# Patient Record
Sex: Male | Born: 1960 | Race: Black or African American | Hispanic: No | Smoking: Never smoker
Health system: Southern US, Community
[De-identification: ages and names within clinical notes are randomized; demographics above are authoritative.]

## PROBLEM LIST (undated history)

## (undated) DIAGNOSIS — C61 Malignant neoplasm of prostate: Secondary | ICD-10-CM

## (undated) HISTORY — PX: ORCHIOPEXY: SHX479

## (undated) HISTORY — PX: PROSTATE SURGERY: SHX751

---

## 1998-09-07 ENCOUNTER — Encounter: Payer: Self-pay | Admitting: Orthopedic Surgery

## 1998-09-07 ENCOUNTER — Ambulatory Visit (HOSPITAL_COMMUNITY): Admission: RE | Admit: 1998-09-07 | Discharge: 1998-09-07 | Payer: Self-pay | Admitting: Orthopedic Surgery

## 1998-09-21 ENCOUNTER — Ambulatory Visit (HOSPITAL_COMMUNITY): Admission: RE | Admit: 1998-09-21 | Discharge: 1998-09-21 | Payer: Self-pay | Admitting: Orthopedic Surgery

## 1998-09-21 ENCOUNTER — Encounter: Payer: Self-pay | Admitting: Orthopedic Surgery

## 1998-10-06 ENCOUNTER — Ambulatory Visit (HOSPITAL_COMMUNITY): Admission: RE | Admit: 1998-10-06 | Discharge: 1998-10-06 | Payer: Self-pay | Admitting: Orthopedic Surgery

## 1998-10-06 ENCOUNTER — Encounter: Payer: Self-pay | Admitting: Orthopedic Surgery

## 1999-10-16 ENCOUNTER — Encounter: Admission: RE | Admit: 1999-10-16 | Discharge: 1999-10-16 | Payer: Self-pay | Admitting: Family Medicine

## 1999-10-16 ENCOUNTER — Encounter: Payer: Self-pay | Admitting: Family Medicine

## 1999-12-07 ENCOUNTER — Ambulatory Visit (HOSPITAL_COMMUNITY): Admission: RE | Admit: 1999-12-07 | Discharge: 1999-12-07 | Payer: Self-pay | Admitting: *Deleted

## 2000-08-21 ENCOUNTER — Other Ambulatory Visit: Admission: RE | Admit: 2000-08-21 | Discharge: 2000-08-21 | Payer: Self-pay | Admitting: Urology

## 2000-09-10 ENCOUNTER — Encounter: Payer: Self-pay | Admitting: Urology

## 2000-09-16 ENCOUNTER — Inpatient Hospital Stay (HOSPITAL_COMMUNITY): Admission: RE | Admit: 2000-09-16 | Discharge: 2000-09-18 | Payer: Self-pay | Admitting: Urology

## 2001-09-11 ENCOUNTER — Ambulatory Visit (HOSPITAL_COMMUNITY): Admission: RE | Admit: 2001-09-11 | Discharge: 2001-09-11 | Payer: Self-pay | Admitting: *Deleted

## 2004-04-24 ENCOUNTER — Encounter: Admission: RE | Admit: 2004-04-24 | Discharge: 2004-04-24 | Payer: Self-pay | Admitting: Orthopedic Surgery

## 2004-05-11 ENCOUNTER — Encounter: Admission: RE | Admit: 2004-05-11 | Discharge: 2004-05-11 | Payer: Self-pay | Admitting: Orthopedic Surgery

## 2005-09-13 ENCOUNTER — Encounter: Admission: RE | Admit: 2005-09-13 | Discharge: 2005-09-13 | Payer: Self-pay | Admitting: Surgery

## 2005-09-16 ENCOUNTER — Ambulatory Visit (HOSPITAL_BASED_OUTPATIENT_CLINIC_OR_DEPARTMENT_OTHER): Admission: RE | Admit: 2005-09-16 | Discharge: 2005-09-16 | Payer: Self-pay | Admitting: Surgery

## 2013-01-31 ENCOUNTER — Emergency Department (HOSPITAL_COMMUNITY): Payer: BC Managed Care – PPO

## 2013-01-31 ENCOUNTER — Observation Stay (HOSPITAL_COMMUNITY)
Admission: EM | Admit: 2013-01-31 | Discharge: 2013-02-01 | Disposition: A | Discharge status: Home / Self Care | Payer: BC Managed Care – PPO | Attending: Internal Medicine | Admitting: Internal Medicine

## 2013-01-31 ENCOUNTER — Observation Stay (HOSPITAL_COMMUNITY): Payer: BC Managed Care – PPO

## 2013-01-31 ENCOUNTER — Encounter (HOSPITAL_COMMUNITY): Payer: Self-pay | Admitting: Emergency Medicine

## 2013-01-31 DIAGNOSIS — I369 Nonrheumatic tricuspid valve disorder, unspecified: Secondary | ICD-10-CM

## 2013-01-31 DIAGNOSIS — R569 Unspecified convulsions: Secondary | ICD-10-CM

## 2013-01-31 DIAGNOSIS — Y92410 Unspecified street and highway as the place of occurrence of the external cause: Secondary | ICD-10-CM | POA: Insufficient documentation

## 2013-01-31 DIAGNOSIS — Y9389 Activity, other specified: Secondary | ICD-10-CM | POA: Insufficient documentation

## 2013-01-31 DIAGNOSIS — E871 Hypo-osmolality and hyponatremia: Secondary | ICD-10-CM | POA: Diagnosis present

## 2013-01-31 DIAGNOSIS — R55 Syncope and collapse: Secondary | ICD-10-CM

## 2013-01-31 DIAGNOSIS — Z8546 Personal history of malignant neoplasm of prostate: Secondary | ICD-10-CM | POA: Insufficient documentation

## 2013-01-31 DIAGNOSIS — S01502A Unspecified open wound of oral cavity, initial encounter: Secondary | ICD-10-CM | POA: Insufficient documentation

## 2013-01-31 DIAGNOSIS — R030 Elevated blood-pressure reading, without diagnosis of hypertension: Secondary | ICD-10-CM

## 2013-01-31 DIAGNOSIS — Z79899 Other long term (current) drug therapy: Secondary | ICD-10-CM | POA: Insufficient documentation

## 2013-01-31 DIAGNOSIS — Z7982 Long term (current) use of aspirin: Secondary | ICD-10-CM | POA: Insufficient documentation

## 2013-01-31 HISTORY — DX: Malignant neoplasm of prostate: C61

## 2013-01-31 LAB — URINALYSIS, ROUTINE W REFLEX MICROSCOPIC
Bilirubin Urine: NEGATIVE
Glucose, UA: NEGATIVE mg/dL
Ketones, ur: NEGATIVE mg/dL
Leukocytes, UA: NEGATIVE
Nitrite: NEGATIVE
Protein, ur: NEGATIVE mg/dL
Specific Gravity, Urine: 1.003 — ABNORMAL LOW (ref 1.005–1.030)
Urobilinogen, UA: 0.2 mg/dL (ref 0.0–1.0)
pH: 6.5 (ref 5.0–8.0)

## 2013-01-31 LAB — RAPID URINE DRUG SCREEN, HOSP PERFORMED
Amphetamines: NOT DETECTED
Barbiturates: NOT DETECTED
Benzodiazepines: NOT DETECTED
Cocaine: NOT DETECTED
Opiates: NOT DETECTED
Tetrahydrocannabinol: NOT DETECTED

## 2013-01-31 LAB — COMPREHENSIVE METABOLIC PANEL
ALT: 21 U/L (ref 0–53)
AST: 42 U/L — ABNORMAL HIGH (ref 0–37)
Albumin: 4.3 g/dL (ref 3.5–5.2)
Alkaline Phosphatase: 59 U/L (ref 39–117)
BUN: 17 mg/dL (ref 6–23)
CO2: 25 mEq/L (ref 19–32)
Calcium: 9 mg/dL (ref 8.4–10.5)
Chloride: 86 mEq/L — ABNORMAL LOW (ref 96–112)
Creatinine, Ser: 0.94 mg/dL (ref 0.50–1.35)
GFR calc Af Amer: >90 mL/min (ref >90)
GFR calc non Af Amer: >90 mL/min (ref >90)
Glucose, Bld: 86 mg/dL (ref 70–99)
Potassium: 3.7 mEq/L (ref 3.5–5.1)
Sodium: 125 mEq/L — ABNORMAL LOW (ref 135–145)
Total Bilirubin: 0.8 mg/dL (ref 0.3–1.2)
Total Protein: 7.4 g/dL (ref 6.0–8.3)

## 2013-01-31 LAB — CBC
HCT: 41.8 % (ref 39.0–52.0)
Hemoglobin: 15 g/dL (ref 13.0–17.0)
MCH: 31.3 pg (ref 26.0–34.0)
MCHC: 35.9 g/dL (ref 30.0–36.0)
MCV: 87.1 fL (ref 78.0–100.0)
Platelets: 234 10*3/uL (ref 150–400)
RBC: 4.8 MIL/uL (ref 4.22–5.81)
RDW: 11.6 % (ref 11.5–15.5)
WBC: 18.6 10*3/uL — ABNORMAL HIGH (ref 4.0–10.5)

## 2013-01-31 LAB — POCT I-STAT TROPONIN I: Troponin i, poc: 0 ng/mL (ref 0.00–0.08)

## 2013-01-31 LAB — ETHANOL: Alcohol, Ethyl (B): <11 mg/dL (ref 0–11)

## 2013-01-31 LAB — POCT I-STAT, CHEM 8
BUN: 17 mg/dL (ref 6–23)
Calcium, Ion: 1.14 mmol/L (ref 1.12–1.23)
Chloride: 90 mEq/L — ABNORMAL LOW (ref 96–112)
Creatinine, Ser: 1.2 mg/dL (ref 0.50–1.35)
Glucose, Bld: 99 mg/dL (ref 70–99)
HCT: 47 % (ref 39.0–52.0)
Hemoglobin: 16 g/dL (ref 13.0–17.0)
Potassium: 3.8 mEq/L (ref 3.5–5.1)
Sodium: 126 mEq/L — ABNORMAL LOW (ref 135–145)
TCO2: 24 mmol/L (ref 0–100)

## 2013-01-31 LAB — TROPONIN I
Troponin I: <0.3 ng/mL (ref <0.30)
Troponin I: <0.3 ng/mL (ref <0.30)

## 2013-01-31 LAB — URINE MICROSCOPIC-ADD ON

## 2013-01-31 LAB — OSMOLALITY, URINE: Osmolality, Ur: 77 mOsm/kg — ABNORMAL LOW (ref 390–1090)

## 2013-01-31 LAB — MAGNESIUM: Magnesium: 2.2 mg/dL (ref 1.5–2.5)

## 2013-01-31 MED ORDER — ENOXAPARIN SODIUM 40 MG/0.4ML ~~LOC~~ SOLN
40.0000 mg | SUBCUTANEOUS | Status: DC
  Administered 2013-01-31: 40 mg via SUBCUTANEOUS
  Filled 2013-01-31 (×2): qty 0.4

## 2013-01-31 MED ORDER — ENOXAPARIN SODIUM 40 MG/0.4ML ~~LOC~~ SOLN
40.0000 mg | SUBCUTANEOUS | Status: DC
  Filled 2013-01-31: qty 0.4

## 2013-01-31 MED ORDER — ACETAMINOPHEN 325 MG PO TABS
650.0000 mg | ORAL_TABLET | Freq: Four times a day (QID) | ORAL | Status: DC | PRN
  Administered 2013-01-31: 650 mg via ORAL
  Filled 2013-01-31: qty 2

## 2013-01-31 MED ORDER — SODIUM CHLORIDE 0.9 % IV SOLN
INTRAVENOUS | Status: DC
  Administered 2013-01-31 (×2): via INTRAVENOUS

## 2013-01-31 MED ORDER — LABETALOL HCL 5 MG/ML IV SOLN
10.0000 mg | Freq: Once | INTRAVENOUS | Status: AC
  Administered 2013-01-31: 10 mg via INTRAVENOUS
  Filled 2013-01-31: qty 4

## 2013-01-31 MED ORDER — VITAMIN B-1 100 MG PO TABS
100.0000 mg | ORAL_TABLET | Freq: Every day | ORAL | Status: DC
  Administered 2013-02-01: 100 mg via ORAL
  Filled 2013-01-31 (×2): qty 1

## 2013-01-31 MED ORDER — ONDANSETRON HCL 4 MG PO TABS: 4.0000 mg | ORAL_TABLET | Freq: Four times a day (QID) | ORAL | Status: DC | PRN

## 2013-01-31 MED ORDER — SODIUM CHLORIDE 0.9 % IJ SOLN: 3.0000 mL | Freq: Two times a day (BID) | INTRAMUSCULAR | Status: DC

## 2013-01-31 MED ORDER — ONDANSETRON HCL 4 MG/2ML IJ SOLN: 4.0000 mg | Freq: Four times a day (QID) | INTRAMUSCULAR | Status: DC | PRN

## 2013-01-31 MED ORDER — ASPIRIN EC 81 MG PO TBEC
81.0000 mg | DELAYED_RELEASE_TABLET | Freq: Every day | ORAL | Status: DC
  Administered 2013-02-01: 81 mg via ORAL
  Filled 2013-01-31 (×2): qty 1

## 2013-01-31 MED ORDER — HYDRALAZINE HCL 20 MG/ML IJ SOLN: 5.0000 mg | Freq: Four times a day (QID) | INTRAMUSCULAR | Status: DC | PRN

## 2013-01-31 MED ORDER — ACETAMINOPHEN 650 MG RE SUPP: 650.0000 mg | Freq: Four times a day (QID) | RECTAL | Status: DC | PRN

## 2013-01-31 MED ORDER — FLUTICASONE PROPIONATE 50 MCG/ACT NA SUSP
1.0000 | Freq: Every day | NASAL | Status: DC
  Administered 2013-01-31 – 2013-02-01 (×2): 1 via NASAL
  Filled 2013-01-31: qty 16

## 2013-01-31 NOTE — ED Notes (Signed)
Pt remains in MRI, will transport upstairs to ready bed following MRI.

## 2013-01-31 NOTE — ED Notes (Signed)
Attempted to give EKG to Attending Physician. Dr. Radford Pax did not accept it at this time.
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
## 2013-01-31 NOTE — Progress Notes (Signed)
VASCULAR LAB PRELIMINARY  PRELIMINARY  PRELIMINARY  PRELIMINARY  Carotid Dopplers completed.    Preliminary report:  There is 1-39% ICA stenosis.  Minimal plaque noted.  Mild intimal wall thickening CCA.  Bilateral vertebral artery flow is antegrade.  Justin Macias, RVT 01/31/2013, 2:33 PM
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
## 2013-01-31 NOTE — Progress Notes (Signed)
  Echocardiogram 2D Echocardiogram has been performed.  Georgian Co 01/31/2013, 5:29 PM

## 2013-02-01 ENCOUNTER — Observation Stay (HOSPITAL_COMMUNITY): Payer: BC Managed Care – PPO

## 2013-02-01 LAB — COMPREHENSIVE METABOLIC PANEL
ALT: 23 U/L (ref 0–53)
AST: 66 U/L — ABNORMAL HIGH (ref 0–37)
Albumin: 3.5 g/dL (ref 3.5–5.2)
Alkaline Phosphatase: 49 U/L (ref 39–117)
BUN: 16 mg/dL (ref 6–23)
CO2: 28 mEq/L (ref 19–32)
Calcium: 8.6 mg/dL (ref 8.4–10.5)
Chloride: 102 mEq/L (ref 96–112)
Creatinine, Ser: 1.16 mg/dL (ref 0.50–1.35)
GFR calc Af Amer: 81 mL/min — ABNORMAL LOW (ref >90)
GFR calc non Af Amer: 70 mL/min — ABNORMAL LOW (ref >90)
Glucose, Bld: 96 mg/dL (ref 70–99)
Potassium: 4 mEq/L (ref 3.5–5.1)
Sodium: 138 mEq/L (ref 135–145)
Total Bilirubin: 1 mg/dL (ref 0.3–1.2)
Total Protein: 6.6 g/dL (ref 6.0–8.3)

## 2013-02-01 LAB — CBC
HCT: 40.6 % (ref 39.0–52.0)
Hemoglobin: 14.3 g/dL (ref 13.0–17.0)
MCH: 31.7 pg (ref 26.0–34.0)
MCHC: 35.2 g/dL (ref 30.0–36.0)
MCV: 90 fL (ref 78.0–100.0)
Platelets: 218 10*3/uL (ref 150–400)
RBC: 4.51 MIL/uL (ref 4.22–5.81)
RDW: 12.1 % (ref 11.5–15.5)
WBC: 13.8 10*3/uL — ABNORMAL HIGH (ref 4.0–10.5)

## 2013-02-01 MED ORDER — THIAMINE HCL 100 MG PO TABS: 100.0000 mg | ORAL_TABLET | Freq: Every day | ORAL | Status: DC

## 2013-02-01 MED ORDER — AMLODIPINE BESYLATE 2.5 MG PO TABS
2.5000 mg | ORAL_TABLET | Freq: Every day | ORAL | Status: DC
  Filled 2013-02-01: qty 1

## 2013-02-01 MED ORDER — AMLODIPINE BESYLATE 5 MG PO TABS: 5.0000 mg | ORAL_TABLET | Freq: Every day | ORAL | Status: DC

## 2013-02-01 MED ORDER — FLUTICASONE PROPIONATE 50 MCG/ACT NA SUSP: 1.0000 | Freq: Every day | NASAL | Status: DC

## 2013-02-01 MED ORDER — AMLODIPINE BESYLATE 2.5 MG PO TABS: 2.5000 mg | ORAL_TABLET | Freq: Every day | ORAL | Status: DC

## 2013-02-01 NOTE — Procedures (Signed)
ELECTROENCEPHALOGRAM REPORT   Patient: Justin Macias       Room #: 5A21 EEG No. ID: [id number] Age: 52 y.o.        Sex: male Referring Physician: Rai Report Date:  02/01/2013        Interpreting Physician: Thana Farr D  History: Justin Macias is an 52 y.o. male presenting after an amnestic episode  Medications:  I have reviewed the patient's current medications. Scheduled: . amLODipine  2.5 mg Oral Daily  . aspirin EC  81 mg Oral Daily  . enoxaparin (LOVENOX) injection  40 mg Subcutaneous Q24H  . fluticasone  1 spray Each Nare Daily  . sodium chloride  3 mL Intravenous Q12H  . thiamine  100 mg Oral Daily    Conditions of Recording:  This is a 16 channel EEG carried out with the patient in the awake state.  Description:  The waking background activity consists of a low voltage, symmetrical, fairly well organized, 12 Hz alpha activity, seen from the parieto-occipital and posterior temporal regions.  Low voltage fast activity, poorly organized, is seen anteriorly and is at times superimposed on more posterior regions.  A mixture of theta and alpha rhythms are seen from the central and temporal regions. The patient drowses with slowing to irregular, low voltage theta and beta activity.   The patient does not drowse or sleep. Hyperventilation and intermittent photic stimulation were performed but failed to elicit any abnormalities.  IMPRESSION: This is a normal awake electroencephalogram.  No epileptiform activity is noted.    Comment:  An EEG with the patient sleep deprived to elicit drowse and light sleep may be desirable to further elicit a possible seizure disorder.     Thana Farr, MD [facility] [phone] 02/01/2013, 2:51 PM
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
## 2013-02-01 NOTE — Progress Notes (Signed)
EEG Completed; Results Pending  

## 2013-02-01 NOTE — Progress Notes (Signed)
Pt provided with dc instructions and education. Pt verbalized udnerstanding. No questions at this time. IV removed with tip intact. Heart monitor cleaned and returned to front. Levonne Spiller, RN
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
## 2013-02-15 ENCOUNTER — Ambulatory Visit (INDEPENDENT_AMBULATORY_CARE_PROVIDER_SITE_OTHER): Payer: BC Managed Care – PPO | Admitting: Neurology

## 2013-02-15 ENCOUNTER — Encounter: Payer: Self-pay | Admitting: Neurology

## 2013-02-15 VITALS — BP 130/80 | HR 66 | Temp 98.1°F | Ht 67.0 in | Wt 148.0 lb

## 2013-02-15 DIAGNOSIS — R569 Unspecified convulsions: Secondary | ICD-10-CM

## 2013-02-15 NOTE — Patient Instructions (Signed)
The seizure could have been caused by the low sodium.  However, it is more typically seen with levels less than 120 (your level was 125).  I would restrict you from driving at this time and then follow up in one month.  We will recheck a sodium level.  If it is normal, we may reinstate driving privileges.  Since this is a one time seizure, I will not start seizure medication anyway.
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
## 2013-03-17 ENCOUNTER — Ambulatory Visit (INDEPENDENT_AMBULATORY_CARE_PROVIDER_SITE_OTHER): Payer: BC Managed Care – PPO | Admitting: Neurology

## 2013-03-17 ENCOUNTER — Encounter: Payer: Self-pay | Admitting: Neurology

## 2013-03-17 VITALS — BP 142/82 | HR 60 | Temp 97.9°F | Ht 67.0 in | Wt 147.0 lb

## 2013-03-17 DIAGNOSIS — R569 Unspecified convulsions: Secondary | ICD-10-CM

## 2013-03-17 LAB — BASIC METABOLIC PANEL
BUN: 16 mg/dL (ref 6–23)
CO2: 29 mEq/L (ref 19–32)
Calcium: 9.2 mg/dL (ref 8.4–10.5)
Chloride: 102 mEq/L (ref 96–112)
Creatinine, Ser: 1.1 mg/dL (ref 0.4–1.5)
GFR: 90.66 mL/min (ref >60.00)
Glucose, Bld: 91 mg/dL (ref 70–99)
Potassium: 4.6 mEq/L (ref 3.5–5.1)
Sodium: 139 mEq/L (ref 135–145)

## 2013-03-17 NOTE — Patient Instructions (Signed)
1.  We will check another sodium level today.  If it is okay then you may resume driving. 2.  I would like you to follow up in 3 months to make sure everything is okay.

## 2013-03-17 NOTE — Addendum Note (Signed)
Addended by: Benay Spice on: 03/17/2013 09:52 AM   Modules accepted: Orders
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
## 2013-03-17 NOTE — Addendum Note (Signed)
Addended by: Benay Spice on: 03/17/2013 10:04 AM   Modules accepted: Orders

## 2013-03-18 ENCOUNTER — Telehealth: Payer: Self-pay | Admitting: Neurology

## 2013-03-18 NOTE — Telephone Encounter (Signed)
Left the patient a message on his home answering machine that his lab work came back normal and that he could start driving again as per Dr. Moises Blood instructions.

## 2013-06-16 ENCOUNTER — Ambulatory Visit: Payer: BC Managed Care – PPO | Admitting: Neurology

## 2017-07-28 ENCOUNTER — Encounter: Payer: Self-pay | Admitting: Radiation Oncology

## 2017-08-21 ENCOUNTER — Encounter: Payer: Self-pay | Admitting: Radiation Oncology

## 2017-08-21 NOTE — Progress Notes (Signed)
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 Retropubic prostatectomy done 09/22/2000. Unfortunately, patient had focal capsular extension/margin involvement.  If Prostate Cancer, Gleason Score is (3 + 4) and PSA is (4.5) pretreatment.   07/21/2017 PSA  0.045 07/2016  PSA  0.033 07/2015  PSA  0.03 06/2014  PSA  0.03 06/2013  PSA  0.02    Past/Anticipated interventions by urology, if any: prostatectomy, routine PSA check, referral to radiation oncology for consideration of salvage XRT in light of rising PSA  Past/Anticipated interventions by medical oncology, if any: no  Weight changes, if any: no  Bowel/Bladder complaints, if any: Denies urinary incontinence. Denies ED. Reports rare urinary leakage only when he is very tired. Denies dysuria or hematuria. IPSS 0.   Nausea/Vomiting, if any: no  Pain issues, if any:  no  SAFETY ISSUES:  Prior radiation? no  Pacemaker/ICD? no  Possible current pregnancy? no  Is the patient on methotrexate? no  Current Complaints / other details:  57 year old male. Divorced. AX: Sulfa Brother with hx of prostate ca. Another brother and mother with hx of colon ca. Maternal uncle with prostate ca. Works for [employer]. Has one daughter. Resides in [place]. One of 11 children. All are alive but one that died in a motorcycle accident at 52.

## 2017-08-25 ENCOUNTER — Encounter: Payer: Self-pay | Admitting: Medical Oncology

## 2017-08-25 ENCOUNTER — Encounter: Payer: Self-pay | Admitting: Radiation Oncology

## 2017-08-25 ENCOUNTER — Other Ambulatory Visit: Payer: Self-pay | Admitting: Radiation Oncology

## 2017-08-25 ENCOUNTER — Ambulatory Visit
Admission: RE | Admit: 2017-08-25 | Discharge: 2017-08-25 | Disposition: A | Discharge status: Home / Self Care | Payer: BLUE CROSS/BLUE SHIELD | Source: Ambulatory Visit | Attending: Radiation Oncology | Admitting: Radiation Oncology

## 2017-08-25 ENCOUNTER — Other Ambulatory Visit: Payer: Self-pay

## 2017-08-25 VITALS — BP 162/101 | HR 70 | Temp 98.0°F | Resp 16 | Ht 68.0 in | Wt 152.8 lb

## 2017-08-25 DIAGNOSIS — C61 Malignant neoplasm of prostate: Secondary | ICD-10-CM | POA: Diagnosis not present

## 2017-08-25 DIAGNOSIS — Z79899 Other long term (current) drug therapy: Secondary | ICD-10-CM | POA: Diagnosis not present

## 2017-08-25 DIAGNOSIS — Z7982 Long term (current) use of aspirin: Secondary | ICD-10-CM | POA: Insufficient documentation

## 2017-08-25 DIAGNOSIS — Z809 Family history of malignant neoplasm, unspecified: Secondary | ICD-10-CM | POA: Diagnosis not present

## 2017-08-25 DIAGNOSIS — Z800 Family history of malignant neoplasm of digestive organs: Secondary | ICD-10-CM | POA: Diagnosis not present

## 2017-08-25 NOTE — Progress Notes (Signed)
Introduced myself to Mr. Justin Macias as the prostate nurse navigator and my role. He was diagnosed with prostate cancer in 2002 and under went prostatectomy. He PSA has slowly been rising. He is here today to discuss salvage radiation. He has a brother with history of prostate cancer and family history of colon cancer. He has never had genetic studies but might be interested since he has a daughter. I gave him my business card and will continue to follow.

## 2017-08-25 NOTE — Progress Notes (Signed)
See progress note under physician encounter. 

## 2017-08-25 NOTE — Progress Notes (Signed)
Radiation Oncology         [phone] ________________________________  Initial Outpatient Consultation  Name: Justin Macias MRN: [id number]  Date: 08/25/2017  DOB: 10-Dec-1960  CC:Justin Jordan, MD  Franchot Gallo, MD   REFERRING PHYSICIAN: Franchot Gallo, MD  DIAGNOSIS: 57 y.o. 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 Malignant neoplasm of prostate ([place]) C61   2. Family history of cancer Z80.9     HISTORY OF PRESENT ILLNESS: Justin Macias is a 57 y.o. male with a diagnosis of prostate cancer in 2002. He underwent radical prostatectomy on 09/22/00 for Geason 3+4 cancer. Final pathology revealed a pT3a, N0 prostate cancer with Gleason score of 3+4. He had focal extracapsular extension and positive margins on the left. He remained under follow-up with Dr. Diona Fanti with undetected PSA until 2015. The patient's PSA was noted to slightly elevate over the past several years, as noted:  07/21/17 0.045 07/2016  0.033 07/2015  0.03 06/2014  0.03 06/2013  0.02  The patient presented to Dr. Diona Fanti on 07/28/17. Per his note, he discussed the unusual risk of biochemical recurrence over 15 years out from his curative procedure. However, given the patient's rising PSA. Dr. Diona Fanti referred the patient to radiation oncology for consideration of salvage radiotherapy.    PREVIOUS RADIATION THERAPY: No  PAST MEDICAL HISTORY:  Past Medical History:  Diagnosis Date  . Prostate cancer ([place])       PAST SURGICAL HISTORY: Past Surgical History:  Procedure Laterality Date  . ORCHIOPEXY    . PROSTATE SURGERY      FAMILY HISTORY:  Family History  Problem Relation Age of Onset  . Heart attack Father   . Prostate cancer Brother   . Colon cancer Mother   . Prostate cancer Maternal Uncle   . Colon cancer Brother     SOCIAL HISTORY:  Social  History   Socioeconomic History  . Marital status: Divorced    Spouse name: Not on file  . Number of children: 1  . Years of education: Not on file  . Highest education level: Not on file  Occupational History    Employer: [employer]  . Financial resource strain: Not on file  . Food insecurity:    Worry: Not on file    Inability: Not on file  . Transportation needs:    Medical: Not on file    Non-medical: Not on file  Tobacco Use  . Smoking status: Never Smoker  . Smokeless tobacco: Never Used  Substance and Sexual Activity  . Alcohol use: Yes    Comment: occasional  . Drug use: No  . Sexual activity: Not Currently  Lifestyle  . Physical activity:    Days per week: Not on file    Minutes per session: Not on file  . Stress: Not on file  Relationships  . Social connections:    Talks on phone: Not on file    Gets together: Not on file    Attends religious service: Not on file    Active member of club or organization: Not on file    Attends meetings of clubs or organizations: Not on file    Relationship status: Not on file  . Intimate partner violence:    Fear of current or ex partner: Not on file    Emotionally abused: Not on  file    Physically abused: Not on file    Forced sexual activity: Not on file  Other Topics Concern  . Not on file  Social History Narrative  . Not on file  The patient is divorced and lives in [place]. He works for [employer].  ALLERGIES: Sulfa antibiotics  MEDICATIONS:  Current Outpatient Medications  Medication Sig Dispense Refill  . amLODipine (NORVASC) 2.5 MG tablet Take 1 tablet (2.5 mg total) by mouth daily. 30 tablet 3  . aspirin EC 81 MG tablet Take 81 mg by mouth daily.    Marland Kitchen GARLIC PO Take 1 capsule by mouth daily.    . Red Yeast Rice Extract (RED YEAST RICE PO) Take 1 capsule by mouth daily.     No current facility-administered medications for this encounter.     REVIEW OF SYSTEMS:  On review of systems, the patient  reports that he is doing well overall. He denies any chest pain, shortness of breath, cough, fevers, chills, night sweats, unintended weight changes. He denies any bowel disturbances, and denies abdominal pain, nausea or vomiting. He denies any new musculoskeletal or joint aches or pains. His IPSS was 0, indicating no urinary symptoms. He is able to complete sexual activity with all  attempts. A complete review of systems is obtained and is otherwise negative.    PHYSICAL EXAM:  Wt Readings from Last 3 Encounters:  08/25/17 152 lb 12.8 oz (69.3 kg)  03/17/13 147 lb (66.7 kg)  02/15/13 148 lb (67.1 kg)   Temp Readings from Last 3 Encounters:  08/25/17 98 F (36.7 C) (Oral)  03/17/13 97.9 F (36.6 C)  02/15/13 98.1 F (36.7 C)   BP Readings from Last 3 Encounters:  08/25/17 (!) 162/101  03/17/13 (!) 142/82  02/15/13 130/80   Pulse Readings from Last 3 Encounters:  08/25/17 70  03/17/13 60  02/15/13 66   Pain Assessment Pain Score: 0-No pain/10  In general this is a well appearing African American male in no acute distress. He is alert and oriented x4 and appropriate throughout the examination. HEENT reveals that the patient is normocephalic, atraumatic. EOMs are intact. Cardiopulmonary assessment is negative for acute distress and he exhibits normal effort.    KPS = 100  100 - Normal; no complaints; no evidence of disease. 90   - Able to carry on normal activity; minor signs or symptoms of disease. 80   - Normal activity with effort; some signs or symptoms of disease. 46   - Cares for self; unable to carry on normal activity or to do active work. 60   - Requires occasional assistance, but is able to care for most of his personal needs. 50   - Requires considerable assistance and frequent medical care. 7   - Disabled; requires special care and assistance. 77   - Severely disabled; hospital admission is indicated although death not imminent. 63   - Very sick; hospital  admission necessary; active supportive treatment necessary. 10   - Moribund; fatal processes progressing rapidly. 0     - Dead  Karnofsky DA, Abelmann [place], Craver LS and Burchenal [facility] [phone]) The use of the nitrogen mustards in the palliative treatment of carcinoma: with particular reference to bronchogenic carcinoma Cancer 1 634-56  LABORATORY DATA:  Lab Results  Component Value Date   WBC 13.8 (H) 02/01/2013   HGB 14.3 02/01/2013   HCT 40.6 02/01/2013   MCV 90.0 02/01/2013   PLT 218 02/01/2013   Lab Results  Component  Value Date   NA 139 03/17/2013   K 4.6 03/17/2013   CL 102 03/17/2013   CO2 29 03/17/2013   Lab Results  Component Value Date   ALT 23 02/01/2013   AST 66 (H) 02/01/2013   ALKPHOS 49 02/01/2013   BILITOT 1.0 02/01/2013     RADIOGRAPHY: No results found.    IMPRESSION/PLAN: 1. 57 y.o. 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 The patient is counseled on the findings and role of radiotherapy to the prostatic fossa over 7 1/2 weeks. We discussed the risks, benefits, short, and long term effects of radiotherapy, and the patient is interested in proceeding. We will have simulation reach out to him to coordinate this and move forward as outlined. 2. Possible genetic predisposition to malignancy. The patient is a candidate for genetic testing given his personal and family history. He was offered referral and is interested in meeting.  In a visit lasting 45 minutes, greater than 50% of the time was spent face to face discussing his case, and coordinating the patient's care.     Carola Rhine, PAC    Tyler Pita, MD  [facility] Direct Dial: [phone]  Fax: [phone] [email or web address]  Skype  LinkedIn    Page Me       This document serves as a record of services personally performed by Tyler Pita, MD and Shona Simpson, PA-C. It was created on their behalf by Bethann Humble, a trained medical scribe. The creation of this record is based on the scribe's personal observations and the provider's statements to them. This document has been checked and approved by the attending provider.

## 2017-09-08 ENCOUNTER — Ambulatory Visit
Admission: RE | Admit: 2017-09-08 | Discharge: 2017-09-08 | Disposition: A | Discharge status: Home / Self Care | Payer: BLUE CROSS/BLUE SHIELD | Source: Ambulatory Visit | Attending: Radiation Oncology | Admitting: Radiation Oncology

## 2017-09-08 ENCOUNTER — Encounter: Payer: Self-pay | Admitting: Medical Oncology

## 2017-09-08 DIAGNOSIS — C61 Malignant neoplasm of prostate: Secondary | ICD-10-CM | POA: Diagnosis not present

## 2017-09-08 DIAGNOSIS — Z510 Encounter for antineoplastic radiation therapy: Secondary | ICD-10-CM | POA: Insufficient documentation

## 2017-09-08 NOTE — Progress Notes (Signed)
  Radiation Oncology         [phone] ________________________________  Name: Justin Macias MRN: [id number]  Date: 09/08/2017  DOB: 1960/06/09  SIMULATION AND TREATMENT PLANNING NOTE    ICD-10-CM   1. Malignant neoplasm of prostate ([place]) C61     DIAGNOSIS: 57 y.o. 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  Identity was confirmed.  All relevant records and images related to the planned course of therapy were reviewed.  The patient freely provided informed written consent to proceed with treatment after reviewing the details related to the planned course of therapy. The consent form was witnessed and verified by the simulation staff.  Then, the patient was set-up in a stable reproducible supine position for radiation therapy.  A vacuum lock pillow device was custom fabricated to position his legs in a reproducible immobilized position.  Then, I performed a urethrogram under sterile conditions to identify the prostatic bed.  CT images were obtained.  Surface markings were placed.  The CT images were loaded into the planning software.  Then the prostate bed target, pelvic lymph node target and avoidance structures including the rectum, bladder, bowel and hips were contoured.  Treatment planning then occurred.  The radiation prescription was entered and confirmed.  A total of one complex treatment devices were fabricated. I have requested : Intensity Modulated Radiotherapy (IMRT) is medically necessary for this case for the following reason:  Rectal sparing.Marland Kitchen  PLAN:  The patient will receive 45 Gy in 25 fractions of 1.8 Gy, followed by a boost to the prostate bed to a total dose of 68.4 Gy with 13 additional fractions of 1.8 Gy.   ________________________________  Sheral Apley Tammi Klippel, M.D.

## 2017-09-15 DIAGNOSIS — Z510 Encounter for antineoplastic radiation therapy: Secondary | ICD-10-CM | POA: Diagnosis not present

## 2017-09-17 ENCOUNTER — Ambulatory Visit
Admission: RE | Admit: 2017-09-17 | Discharge: 2017-09-17 | Disposition: A | Discharge status: Home / Self Care | Payer: BLUE CROSS/BLUE SHIELD | Source: Ambulatory Visit | Attending: Radiation Oncology | Admitting: Radiation Oncology

## 2017-09-17 DIAGNOSIS — Z510 Encounter for antineoplastic radiation therapy: Secondary | ICD-10-CM | POA: Diagnosis not present

## 2017-09-18 ENCOUNTER — Telehealth: Payer: Self-pay | Admitting: Genetic Counselor

## 2017-09-18 ENCOUNTER — Ambulatory Visit
Admission: RE | Admit: 2017-09-18 | Discharge: 2017-09-18 | Disposition: A | Discharge status: Home / Self Care | Payer: BLUE CROSS/BLUE SHIELD | Source: Ambulatory Visit | Attending: Radiation Oncology | Admitting: Radiation Oncology

## 2017-09-18 ENCOUNTER — Encounter: Payer: Self-pay | Admitting: Genetic Counselor

## 2017-09-18 DIAGNOSIS — Z510 Encounter for antineoplastic radiation therapy: Secondary | ICD-10-CM | POA: Diagnosis not present

## 2017-09-18 NOTE — Telephone Encounter (Signed)
A genetic counseling appt has been scheduled for the pt to see Roma Kayser on 8/12 at 9am. Pt preferred to have genetic counseling after his radiation treatment is over. Letter mailed to the pt.

## 2017-09-19 ENCOUNTER — Ambulatory Visit
Admission: RE | Admit: 2017-09-19 | Discharge: 2017-09-19 | Disposition: A | Discharge status: Home / Self Care | Payer: BLUE CROSS/BLUE SHIELD | Source: Ambulatory Visit | Attending: Radiation Oncology | Admitting: Radiation Oncology

## 2017-09-19 DIAGNOSIS — Z510 Encounter for antineoplastic radiation therapy: Secondary | ICD-10-CM | POA: Diagnosis not present

## 2017-09-22 ENCOUNTER — Ambulatory Visit
Admission: RE | Admit: 2017-09-22 | Discharge: 2017-09-22 | Disposition: A | Discharge status: Home / Self Care | Payer: BLUE CROSS/BLUE SHIELD | Source: Ambulatory Visit | Attending: Radiation Oncology | Admitting: Radiation Oncology

## 2017-09-22 DIAGNOSIS — Z510 Encounter for antineoplastic radiation therapy: Secondary | ICD-10-CM | POA: Diagnosis not present

## 2017-09-23 ENCOUNTER — Ambulatory Visit
Admission: RE | Admit: 2017-09-23 | Discharge: 2017-09-23 | Disposition: A | Discharge status: Home / Self Care | Payer: BLUE CROSS/BLUE SHIELD | Source: Ambulatory Visit | Attending: Radiation Oncology | Admitting: Radiation Oncology

## 2017-09-23 DIAGNOSIS — Z510 Encounter for antineoplastic radiation therapy: Secondary | ICD-10-CM | POA: Diagnosis not present

## 2017-09-24 ENCOUNTER — Ambulatory Visit
Admission: RE | Admit: 2017-09-24 | Discharge: 2017-09-24 | Disposition: A | Discharge status: Home / Self Care | Payer: BLUE CROSS/BLUE SHIELD | Source: Ambulatory Visit | Attending: Radiation Oncology | Admitting: Radiation Oncology

## 2017-09-24 DIAGNOSIS — Z510 Encounter for antineoplastic radiation therapy: Secondary | ICD-10-CM | POA: Diagnosis not present

## 2017-09-25 ENCOUNTER — Ambulatory Visit
Admission: RE | Admit: 2017-09-25 | Discharge: 2017-09-25 | Disposition: A | Discharge status: Home / Self Care | Payer: BLUE CROSS/BLUE SHIELD | Source: Ambulatory Visit | Attending: Radiation Oncology | Admitting: Radiation Oncology

## 2017-09-25 DIAGNOSIS — Z510 Encounter for antineoplastic radiation therapy: Secondary | ICD-10-CM | POA: Diagnosis not present

## 2017-09-26 ENCOUNTER — Ambulatory Visit
Admission: RE | Admit: 2017-09-26 | Discharge: 2017-09-26 | Disposition: A | Discharge status: Home / Self Care | Payer: BLUE CROSS/BLUE SHIELD | Source: Ambulatory Visit | Attending: Radiation Oncology | Admitting: Radiation Oncology

## 2017-09-26 DIAGNOSIS — Z510 Encounter for antineoplastic radiation therapy: Secondary | ICD-10-CM | POA: Diagnosis not present

## 2017-09-29 ENCOUNTER — Ambulatory Visit
Admission: RE | Admit: 2017-09-29 | Discharge: 2017-09-29 | Disposition: A | Discharge status: Home / Self Care | Payer: BLUE CROSS/BLUE SHIELD | Source: Ambulatory Visit | Attending: Radiation Oncology | Admitting: Radiation Oncology

## 2017-09-29 DIAGNOSIS — Z510 Encounter for antineoplastic radiation therapy: Secondary | ICD-10-CM | POA: Diagnosis not present

## 2017-09-30 ENCOUNTER — Ambulatory Visit
Admission: RE | Admit: 2017-09-30 | Discharge: 2017-09-30 | Disposition: A | Discharge status: Home / Self Care | Payer: BLUE CROSS/BLUE SHIELD | Source: Ambulatory Visit | Attending: Radiation Oncology | Admitting: Radiation Oncology

## 2017-09-30 DIAGNOSIS — Z510 Encounter for antineoplastic radiation therapy: Secondary | ICD-10-CM | POA: Diagnosis not present

## 2017-10-01 ENCOUNTER — Ambulatory Visit
Admission: RE | Admit: 2017-10-01 | Discharge: 2017-10-01 | Disposition: A | Discharge status: Home / Self Care | Payer: BLUE CROSS/BLUE SHIELD | Source: Ambulatory Visit | Attending: Radiation Oncology | Admitting: Radiation Oncology

## 2017-10-01 DIAGNOSIS — Z510 Encounter for antineoplastic radiation therapy: Secondary | ICD-10-CM | POA: Diagnosis not present

## 2017-10-02 ENCOUNTER — Ambulatory Visit
Admission: RE | Admit: 2017-10-02 | Discharge: 2017-10-02 | Disposition: A | Discharge status: Home / Self Care | Payer: BLUE CROSS/BLUE SHIELD | Source: Ambulatory Visit | Attending: Radiation Oncology | Admitting: Radiation Oncology

## 2017-10-02 DIAGNOSIS — Z510 Encounter for antineoplastic radiation therapy: Secondary | ICD-10-CM | POA: Diagnosis not present

## 2017-10-03 ENCOUNTER — Ambulatory Visit
Admission: RE | Admit: 2017-10-03 | Discharge: 2017-10-03 | Disposition: A | Discharge status: Home / Self Care | Payer: BLUE CROSS/BLUE SHIELD | Source: Ambulatory Visit | Attending: Radiation Oncology | Admitting: Radiation Oncology

## 2017-10-03 DIAGNOSIS — Z510 Encounter for antineoplastic radiation therapy: Secondary | ICD-10-CM | POA: Diagnosis not present

## 2017-10-06 ENCOUNTER — Ambulatory Visit
Admission: RE | Admit: 2017-10-06 | Discharge: 2017-10-06 | Disposition: A | Discharge status: Home / Self Care | Payer: BLUE CROSS/BLUE SHIELD | Source: Ambulatory Visit | Attending: Radiation Oncology | Admitting: Radiation Oncology

## 2017-10-06 DIAGNOSIS — C61 Malignant neoplasm of prostate: Secondary | ICD-10-CM | POA: Insufficient documentation

## 2017-10-06 DIAGNOSIS — Z510 Encounter for antineoplastic radiation therapy: Secondary | ICD-10-CM | POA: Diagnosis not present

## 2017-10-07 ENCOUNTER — Ambulatory Visit
Admission: RE | Admit: 2017-10-07 | Discharge: 2017-10-07 | Disposition: A | Discharge status: Home / Self Care | Payer: BLUE CROSS/BLUE SHIELD | Source: Ambulatory Visit | Attending: Radiation Oncology | Admitting: Radiation Oncology

## 2017-10-07 DIAGNOSIS — C61 Malignant neoplasm of prostate: Secondary | ICD-10-CM | POA: Diagnosis not present

## 2017-10-08 ENCOUNTER — Ambulatory Visit
Admission: RE | Admit: 2017-10-08 | Discharge: 2017-10-08 | Disposition: A | Discharge status: Home / Self Care | Payer: BLUE CROSS/BLUE SHIELD | Source: Ambulatory Visit | Attending: Radiation Oncology | Admitting: Radiation Oncology

## 2017-10-08 DIAGNOSIS — C61 Malignant neoplasm of prostate: Secondary | ICD-10-CM | POA: Diagnosis not present

## 2017-10-10 ENCOUNTER — Ambulatory Visit
Admission: RE | Admit: 2017-10-10 | Discharge: 2017-10-10 | Disposition: A | Discharge status: Home / Self Care | Payer: BLUE CROSS/BLUE SHIELD | Source: Ambulatory Visit | Attending: Radiation Oncology | Admitting: Radiation Oncology

## 2017-10-10 DIAGNOSIS — C61 Malignant neoplasm of prostate: Secondary | ICD-10-CM | POA: Diagnosis not present

## 2017-10-13 ENCOUNTER — Ambulatory Visit
Admission: RE | Admit: 2017-10-13 | Discharge: 2017-10-13 | Disposition: A | Discharge status: Home / Self Care | Payer: BLUE CROSS/BLUE SHIELD | Source: Ambulatory Visit | Attending: Radiation Oncology | Admitting: Radiation Oncology

## 2017-10-13 DIAGNOSIS — C61 Malignant neoplasm of prostate: Secondary | ICD-10-CM | POA: Diagnosis not present

## 2017-10-14 ENCOUNTER — Ambulatory Visit
Admission: RE | Admit: 2017-10-14 | Discharge: 2017-10-14 | Disposition: A | Discharge status: Home / Self Care | Payer: BLUE CROSS/BLUE SHIELD | Source: Ambulatory Visit | Attending: Radiation Oncology | Admitting: Radiation Oncology

## 2017-10-14 DIAGNOSIS — C61 Malignant neoplasm of prostate: Secondary | ICD-10-CM | POA: Diagnosis not present

## 2017-10-15 ENCOUNTER — Ambulatory Visit
Admission: RE | Admit: 2017-10-15 | Discharge: 2017-10-15 | Disposition: A | Discharge status: Home / Self Care | Payer: BLUE CROSS/BLUE SHIELD | Source: Ambulatory Visit | Attending: Radiation Oncology | Admitting: Radiation Oncology

## 2017-10-15 DIAGNOSIS — C61 Malignant neoplasm of prostate: Secondary | ICD-10-CM | POA: Diagnosis not present

## 2017-10-16 ENCOUNTER — Ambulatory Visit
Admission: RE | Admit: 2017-10-16 | Discharge: 2017-10-16 | Disposition: A | Discharge status: Home / Self Care | Payer: BLUE CROSS/BLUE SHIELD | Source: Ambulatory Visit | Attending: Radiation Oncology | Admitting: Radiation Oncology

## 2017-10-16 DIAGNOSIS — C61 Malignant neoplasm of prostate: Secondary | ICD-10-CM | POA: Diagnosis not present

## 2017-10-17 ENCOUNTER — Ambulatory Visit
Admission: RE | Admit: 2017-10-17 | Discharge: 2017-10-17 | Disposition: A | Discharge status: Home / Self Care | Payer: BLUE CROSS/BLUE SHIELD | Source: Ambulatory Visit | Attending: Radiation Oncology | Admitting: Radiation Oncology

## 2017-10-17 DIAGNOSIS — C61 Malignant neoplasm of prostate: Secondary | ICD-10-CM | POA: Diagnosis not present

## 2017-10-20 ENCOUNTER — Ambulatory Visit
Admission: RE | Admit: 2017-10-20 | Discharge: 2017-10-20 | Disposition: A | Discharge status: Home / Self Care | Payer: BLUE CROSS/BLUE SHIELD | Source: Ambulatory Visit | Attending: Radiation Oncology | Admitting: Radiation Oncology

## 2017-10-20 DIAGNOSIS — C61 Malignant neoplasm of prostate: Secondary | ICD-10-CM | POA: Diagnosis not present

## 2017-10-21 ENCOUNTER — Ambulatory Visit
Admission: RE | Admit: 2017-10-21 | Discharge: 2017-10-21 | Disposition: A | Discharge status: Home / Self Care | Payer: BLUE CROSS/BLUE SHIELD | Source: Ambulatory Visit | Attending: Radiation Oncology | Admitting: Radiation Oncology

## 2017-10-21 DIAGNOSIS — C61 Malignant neoplasm of prostate: Secondary | ICD-10-CM | POA: Diagnosis not present

## 2017-10-22 ENCOUNTER — Ambulatory Visit
Admission: RE | Admit: 2017-10-22 | Discharge: 2017-10-22 | Disposition: A | Discharge status: Home / Self Care | Payer: BLUE CROSS/BLUE SHIELD | Source: Ambulatory Visit | Attending: Radiation Oncology | Admitting: Radiation Oncology

## 2017-10-22 DIAGNOSIS — C61 Malignant neoplasm of prostate: Secondary | ICD-10-CM | POA: Diagnosis not present

## 2017-10-23 ENCOUNTER — Ambulatory Visit
Admission: RE | Admit: 2017-10-23 | Discharge: 2017-10-23 | Disposition: A | Discharge status: Home / Self Care | Payer: BLUE CROSS/BLUE SHIELD | Source: Ambulatory Visit | Attending: Radiation Oncology | Admitting: Radiation Oncology

## 2017-10-23 DIAGNOSIS — C61 Malignant neoplasm of prostate: Secondary | ICD-10-CM | POA: Diagnosis not present

## 2017-10-24 ENCOUNTER — Ambulatory Visit
Admission: RE | Admit: 2017-10-24 | Discharge: 2017-10-24 | Disposition: A | Discharge status: Home / Self Care | Payer: BLUE CROSS/BLUE SHIELD | Source: Ambulatory Visit | Attending: Radiation Oncology | Admitting: Radiation Oncology

## 2017-10-24 DIAGNOSIS — C61 Malignant neoplasm of prostate: Secondary | ICD-10-CM | POA: Diagnosis not present

## 2017-10-27 ENCOUNTER — Ambulatory Visit
Admission: RE | Admit: 2017-10-27 | Discharge: 2017-10-27 | Disposition: A | Discharge status: Home / Self Care | Payer: BLUE CROSS/BLUE SHIELD | Source: Ambulatory Visit | Attending: Radiation Oncology | Admitting: Radiation Oncology

## 2017-10-27 DIAGNOSIS — C61 Malignant neoplasm of prostate: Secondary | ICD-10-CM | POA: Diagnosis not present

## 2017-10-28 ENCOUNTER — Ambulatory Visit
Admission: RE | Admit: 2017-10-28 | Discharge: 2017-10-28 | Disposition: A | Discharge status: Home / Self Care | Payer: BLUE CROSS/BLUE SHIELD | Source: Ambulatory Visit | Attending: Radiation Oncology | Admitting: Radiation Oncology

## 2017-10-28 DIAGNOSIS — C61 Malignant neoplasm of prostate: Secondary | ICD-10-CM | POA: Diagnosis not present

## 2017-10-29 ENCOUNTER — Ambulatory Visit
Admission: RE | Admit: 2017-10-29 | Discharge: 2017-10-29 | Disposition: A | Discharge status: Home / Self Care | Payer: BLUE CROSS/BLUE SHIELD | Source: Ambulatory Visit | Attending: Radiation Oncology | Admitting: Radiation Oncology

## 2017-10-29 DIAGNOSIS — C61 Malignant neoplasm of prostate: Secondary | ICD-10-CM | POA: Diagnosis not present

## 2017-10-30 ENCOUNTER — Ambulatory Visit
Admission: RE | Admit: 2017-10-30 | Discharge: 2017-10-30 | Disposition: A | Discharge status: Home / Self Care | Payer: BLUE CROSS/BLUE SHIELD | Source: Ambulatory Visit | Attending: Radiation Oncology | Admitting: Radiation Oncology

## 2017-10-30 DIAGNOSIS — C61 Malignant neoplasm of prostate: Secondary | ICD-10-CM | POA: Diagnosis not present

## 2017-10-31 ENCOUNTER — Ambulatory Visit
Admission: RE | Admit: 2017-10-31 | Discharge: 2017-10-31 | Disposition: A | Discharge status: Home / Self Care | Payer: BLUE CROSS/BLUE SHIELD | Source: Ambulatory Visit | Attending: Radiation Oncology | Admitting: Radiation Oncology

## 2017-10-31 DIAGNOSIS — C61 Malignant neoplasm of prostate: Secondary | ICD-10-CM | POA: Diagnosis not present

## 2017-11-03 ENCOUNTER — Ambulatory Visit
Admission: RE | Admit: 2017-11-03 | Discharge: 2017-11-03 | Disposition: A | Discharge status: Home / Self Care | Payer: BLUE CROSS/BLUE SHIELD | Source: Ambulatory Visit | Attending: Radiation Oncology | Admitting: Radiation Oncology

## 2017-11-03 DIAGNOSIS — C61 Malignant neoplasm of prostate: Secondary | ICD-10-CM | POA: Diagnosis not present

## 2017-11-04 ENCOUNTER — Ambulatory Visit
Admission: RE | Admit: 2017-11-04 | Discharge: 2017-11-04 | Disposition: A | Discharge status: Home / Self Care | Payer: BLUE CROSS/BLUE SHIELD | Source: Ambulatory Visit | Attending: Radiation Oncology | Admitting: Radiation Oncology

## 2017-11-04 DIAGNOSIS — C61 Malignant neoplasm of prostate: Secondary | ICD-10-CM | POA: Diagnosis not present

## 2017-11-05 ENCOUNTER — Ambulatory Visit
Admission: RE | Admit: 2017-11-05 | Discharge: 2017-11-05 | Disposition: A | Discharge status: Home / Self Care | Payer: BLUE CROSS/BLUE SHIELD | Source: Ambulatory Visit | Attending: Radiation Oncology | Admitting: Radiation Oncology

## 2017-11-05 DIAGNOSIS — C61 Malignant neoplasm of prostate: Secondary | ICD-10-CM | POA: Diagnosis not present

## 2017-11-06 ENCOUNTER — Ambulatory Visit
Admission: RE | Admit: 2017-11-06 | Discharge: 2017-11-06 | Disposition: A | Discharge status: Home / Self Care | Payer: BLUE CROSS/BLUE SHIELD | Source: Ambulatory Visit | Attending: Radiation Oncology | Admitting: Radiation Oncology

## 2017-11-06 DIAGNOSIS — Z510 Encounter for antineoplastic radiation therapy: Secondary | ICD-10-CM | POA: Diagnosis not present

## 2017-11-06 DIAGNOSIS — C61 Malignant neoplasm of prostate: Secondary | ICD-10-CM | POA: Insufficient documentation

## 2017-11-07 ENCOUNTER — Ambulatory Visit
Admission: RE | Admit: 2017-11-07 | Discharge: 2017-11-07 | Disposition: A | Discharge status: Home / Self Care | Payer: BLUE CROSS/BLUE SHIELD | Source: Ambulatory Visit | Attending: Radiation Oncology | Admitting: Radiation Oncology

## 2017-11-07 DIAGNOSIS — C61 Malignant neoplasm of prostate: Secondary | ICD-10-CM | POA: Diagnosis not present

## 2017-11-10 ENCOUNTER — Encounter: Payer: Self-pay | Admitting: Radiation Oncology

## 2017-11-10 ENCOUNTER — Ambulatory Visit
Admission: RE | Admit: 2017-11-10 | Discharge: 2017-11-10 | Disposition: A | Discharge status: Home / Self Care | Payer: BLUE CROSS/BLUE SHIELD | Source: Ambulatory Visit | Attending: Radiation Oncology | Admitting: Radiation Oncology

## 2017-11-10 DIAGNOSIS — C61 Malignant neoplasm of prostate: Secondary | ICD-10-CM | POA: Diagnosis not present

## 2017-11-12 NOTE — Progress Notes (Signed)
  Radiation Oncology         [phone]) [phone] ________________________________  Name: Justin Macias MRN: [id number]  Date: 11/10/2017  DOB: [birth year]  End of Treatment Note  Diagnosis:   57 y.o. 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  Beams/energy:   The prostatic fossa was treated using helical intensity modulated radiotherapy delivering 6 megavolt photons. Image guidance was performed with megavoltage CT studies prior to each fraction. He was immobilized with a body fix lower extremity mold.  Narrative: The patient tolerated radiation treatment relatively well.  He experienced increased fatigue over his course of treatment but otherwise denied any urinary irritation or bowel changes.   Plan: The patient has completed radiation treatment. He will return to radiation oncology clinic for routine followup in one month. I advised him to call or return sooner if he has any questions or concerns related to his recovery or treatment. ________________________________  Sheral Apley. Tammi Klippel, M.D.  This document serves as a record of services personally performed by Tyler Pita, MD. It was created on his behalf by Rae Lips, a trained medical scribe. The creation of this record is based on the scribe's personal observations and the provider's statements to them. This document has been checked and approved by the attending provider.

## 2017-11-17 ENCOUNTER — Encounter: Payer: BLUE CROSS/BLUE SHIELD | Admitting: Genetic Counselor

## 2017-11-17 ENCOUNTER — Other Ambulatory Visit: Payer: BLUE CROSS/BLUE SHIELD

## 2017-12-11 ENCOUNTER — Other Ambulatory Visit: Payer: Self-pay

## 2017-12-11 ENCOUNTER — Encounter: Payer: Self-pay | Admitting: Urology

## 2017-12-11 ENCOUNTER — Ambulatory Visit
Admission: RE | Admit: 2017-12-11 | Discharge: 2017-12-11 | Disposition: A | Discharge status: Home / Self Care | Payer: BLUE CROSS/BLUE SHIELD | Source: Ambulatory Visit | Attending: Urology | Admitting: Urology

## 2017-12-11 VITALS — BP 148/98 | HR 66 | Temp 98.2°F | Resp 15 | Ht 68.0 in | Wt 142.6 lb

## 2017-12-11 DIAGNOSIS — Z882 Allergy status to sulfonamides status: Secondary | ICD-10-CM | POA: Insufficient documentation

## 2017-12-11 DIAGNOSIS — Z7982 Long term (current) use of aspirin: Secondary | ICD-10-CM | POA: Insufficient documentation

## 2017-12-11 DIAGNOSIS — C61 Malignant neoplasm of prostate: Secondary | ICD-10-CM | POA: Diagnosis present

## 2017-12-11 DIAGNOSIS — Z79899 Other long term (current) drug therapy: Secondary | ICD-10-CM | POA: Insufficient documentation

## 2017-12-11 NOTE — Progress Notes (Signed)
Radiation Oncology         (336) [phone] ________________________________  Name: Justin Macias MRN: [id number]  Date: 12/11/2017  DOB: 11-Nov-1960  Post Treatment Note  CC: Jonathon Jordan, MD  Franchot Gallo, MD  Diagnosis:   57 y.o. 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   Narrative:  The patient returns today for routine follow-up.  He tolerated radiation treatment relatively well.  He experienced increased fatigue over his course of treatment but otherwise denied any urinary irritation or bowel changes.                               On review of systems, the patient states that he is doing very well overall.  He reports complete resolution of lower urinary tract symptoms and is back to his baseline currently.  He specifically denies dysuria, gross hematuria, straining to void, incomplete bladder emptying or incontinence.  He denies abdominal pain, nausea, vomiting, diarrhea or constipation.  He reports a healthy appetite and is maintaining his weight.  He denies any significant fatigue at this point.  ALLERGIES:  is allergic to sulfa antibiotics.  Meds: Current Outpatient Medications  Medication Sig Dispense Refill  . amLODipine (NORVASC) 2.5 MG tablet Take 1 tablet (2.5 mg total) by mouth daily. 30 tablet 3  . aspirin EC 81 MG tablet Take 81 mg by mouth daily.    Marland Kitchen GARLIC PO Take 1 capsule by mouth daily.    . Red Yeast Rice Extract (RED YEAST RICE PO) Take 1 capsule by mouth daily.     No current facility-administered medications for this encounter.     Physical Findings:  height is 5\' 8"  (1.727 m) and weight is 142 lb 9.6  oz (64.7 kg). His oral temperature is 98.2 F (36.8 C). His blood pressure is 148/98 (abnormal) and his pulse is 66. His respiration is 15 and oxygen saturation is 100%.  Pain Assessment Pain Score: 0-No pain/10 In general this is a well appearing African-American male in no acute distress.  He's alert and oriented x4 and appropriate throughout the examination. Cardiopulmonary assessment is negative for acute distress and he exhibits normal effort.   Lab Findings: Lab Results  Component Value Date   WBC 13.8 (H) 02/01/2013   HGB 14.3 02/01/2013   HCT 40.6 02/01/2013   MCV 90.0 02/01/2013   PLT 218 02/01/2013     Radiographic Findings: No results found.  Impression/Plan: 1. 57 y.o. 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   He will continue to follow up with urology for ongoing PSA determinations. He does not currently have a scheduled follow-up visit with Dr. Diona Fanti but anticipates seeing him sometime in November 2019 for a repeat PSA. He understands what to expect with regards to PSA monitoring going forward. I will look forward to following his response to treatment via correspondence with urology, and would be happy to continue to participate in his care  if clinically indicated. I talked to the patient about what to expect in the future, including his risk for erectile dysfunction and rectal bleeding. I encouraged him to call or return to the office if he has any questions regarding his previous radiation or possible radiation side effects. He was comfortable with this plan and will follow up as needed.    Nicholos Johns, PA-C

## 2020-07-19 ENCOUNTER — Other Ambulatory Visit: Payer: Self-pay

## 2020-07-19 ENCOUNTER — Ambulatory Visit
Admission: EM | Admit: 2020-07-19 | Discharge: 2020-07-19 | Disposition: A | Discharge status: Home / Self Care | Payer: BC Managed Care – PPO | Attending: Family Medicine | Admitting: Family Medicine

## 2020-07-19 ENCOUNTER — Ambulatory Visit (INDEPENDENT_AMBULATORY_CARE_PROVIDER_SITE_OTHER): Payer: BC Managed Care – PPO

## 2020-07-19 DIAGNOSIS — W19XXXA Unspecified fall, initial encounter: Secondary | ICD-10-CM

## 2020-07-19 DIAGNOSIS — S79912A Unspecified injury of left hip, initial encounter: Secondary | ICD-10-CM | POA: Diagnosis not present

## 2020-07-19 DIAGNOSIS — M25552 Pain in left hip: Secondary | ICD-10-CM

## 2020-07-19 MED ORDER — TIZANIDINE HCL 2 MG PO TABS: 2.0000 mg | ORAL_TABLET | Freq: Four times a day (QID) | ORAL | 0 refills | Status: DC | PRN

## 2020-07-19 NOTE — Discharge Instructions (Signed)
Take tylenol 500 mg every 6 hours as needed for hip pain. I have prescribed tizanidine 2 mg at bedtime for pain if needed. Schedule next available appointment with primary care provider.

## 2020-07-19 NOTE — ED Provider Notes (Signed)
[facility]    CSN: [id number] Arrival date & time: 07/19/20  1549      History   Chief Complaint Chief Complaint  Patient presents with  . Fall    HPI Justin Macias is a 61 y.o. male.   HPI  Patient with medical history of prostate cancer, seizure, and syncope reports he sustained a fall earlier today after falling from his porch. He fell backwards and is unaware of he lost consciousness, but be denies tripping and or falling. He had not eaten any food prior to this fall occurring. He had drank water only. He works late shift and feels that he is not resting appropriately. He has felt lightheaded over the last few weeks. He had a physical in December and reports PCP denied any abnormal findings associated with lab work. History of seizure and reports neurology exam and studies did not conclude any findings related to seizure.  Past Medical History:  Diagnosis Date  . Prostate cancer [facility])     Patient Active Problem List   Diagnosis Date Noted  . Malignant neoplasm of prostate ([place]) 08/25/2017  . Convulsions/seizures ([place]) 01/31/2013  . Syncope 01/31/2013  . Hyponatremia 01/31/2013    Past Surgical History:  Procedure Laterality Date  . ORCHIOPEXY    . PROSTATE SURGERY         Home Medications    Prior to Admission medications   Medication Sig Start Date End Date Taking? Authorizing Provider  amLODipine (NORVASC) 2.5 MG tablet Take 1 tablet (2.5 mg total) by mouth daily. 02/01/13   Rai, Vernelle Emerald, MD  aspirin EC 81 MG tablet Take 81 mg by mouth daily.    [provider]  GARLIC PO Take 1 capsule by mouth daily.    [provider]  Red Yeast Rice Extract (RED YEAST RICE PO) Take 1 capsule by mouth daily.    [provider]    Family History Family History  Problem Relation Age of Onset  . Heart attack Father   . Prostate cancer Brother   . Colon cancer Mother   . Prostate cancer Maternal Uncle   . Colon cancer Brother      Social History Social History   Tobacco Use  . Smoking status: Never Smoker  . Smokeless tobacco: Never Used  Vaping Use  . Vaping Use: Never used  Substance Use Topics  . Alcohol use: Yes    Comment: occasional  . Drug use: No     Allergies   Sulfa antibiotics   Review of Systems Review of Systems Pertinent negatives listed in HPI  Physical Exam Triage Vital Signs ED Triage Vitals [07/19/20 1650]  Enc Vitals Group     BP 137/87     Pulse Rate 73     Resp 18     Temp 98.5 F (36.9 C)     Temp Source Oral     SpO2 98 %     Weight      Height      Head Circumference      Peak Flow      Pain Score 9     Pain Loc      Pain Edu?      Excl. in [place]?    No data found.  Updated Vital Signs BP 137/87 (BP Location: Left Arm)   Pulse 73   Temp 98.5 F (36.9 C) (Oral)   Resp 18   SpO2 98%   Visual Acuity Right Eye Distance:  Left Eye Distance:   Bilateral Distance:    Right Eye Near:   Left Eye Near:    Bilateral Near:     Physical Exam Constitutional:      Appearance: Normal appearance.  Cardiovascular:     Rate and Rhythm: Normal rate and regular rhythm.  Pulmonary:     Effort: Pulmonary effort is normal.     Breath sounds: Normal breath sounds.  Musculoskeletal:     Left hip: Tenderness and bony tenderness present. Normal range of motion. Normal strength.       Legs:  Skin:    Capillary Refill: Capillary refill takes less than 2 seconds.  Neurological:     General: No focal deficit present.     Mental Status: He is alert and oriented to person, place, and time.     Motor: No weakness.     Coordination: Coordination normal.     Gait: Gait normal.  Psychiatric:        Mood and Affect: Mood normal.        Behavior: Behavior normal.        Thought Content: Thought content normal.        Judgment: Judgment normal.    UC Treatments / Results  Labs (all labs ordered are listed, but only abnormal results are displayed) Labs Reviewed - No  data to display  EKG   Radiology DG Hip Unilat W or Wo Pelvis 2-3 Views Left  Result Date: 07/19/2020 CLINICAL DATA:  Left hip pain, fell off porch EXAM: DG HIP (WITH OR WITHOUT PELVIS) 2-3V LEFT COMPARISON:  None. FINDINGS: Frontal view of the pelvis as well as frontal and frogleg lateral views of the left hip are obtained. No acute displaced fracture, subluxation, or dislocation. Joint spaces are well preserved. Sacroiliac joints are normal. IMPRESSION: 1. Unremarkable pelvis and left hip. Electronically Signed   By: Randa Ngo M.D.   On: 07/19/2020 17:15    Procedures Procedures (including critical care time)  Medications Ordered in UC Medications - No data to display  Initial Impression / Assessment and Plan / UC Course  I have reviewed the triage vital signs and the nursing notes.  Pertinent labs & imaging results that were available during my care of the patient were reviewed by me and considered in my medical decision making (see chart for details).    Hip injury related to a fall imaging was negative. Take Tylenol 500 mg every 6 hours as needed for hip pain.  Tizanidine 2 mg at bedtime as needed for pain. Orthopedic evaluation if symptoms worsen or do not improve. Final Clinical Impressions(s) / UC Diagnoses   Final diagnoses:  Hip injury, left, initial encounter  Fall, initial encounter     Discharge Instructions     Take tylenol 500 mg every 6 hours as needed for hip pain. I have prescribed tizanidine 2 mg at bedtime for pain if needed. Schedule next available appointment with primary care provider.     ED Prescriptions    Medication Sig Dispense Auth. Provider   tiZANidine (ZANAFLEX) 2 MG tablet Take 1 tablet (2 mg total) by mouth every 6 (six) hours as needed for muscle spasms. 20 tablet Scot Jun, FNP     PDMP not reviewed this encounter.   Marco, [place], [place] 07/23/20 [phone]

## 2020-07-19 NOTE — ED Triage Notes (Signed)
Pt states fell backwards off his front porch around 2-230p today. States unknown if he passed out or not but denies tripping and falling. States has been having episodes of feeling lightheaded for the past 2wks. Pt c/o lt hip pain from the fall. Pt a/o x4.

## 2020-08-22 ENCOUNTER — Emergency Department (HOSPITAL_COMMUNITY)
Admission: EM | Admit: 2020-08-22 | Discharge: 2020-08-22 | Disposition: A | Discharge status: Home / Self Care | Payer: BC Managed Care – PPO | Attending: Emergency Medicine | Admitting: Emergency Medicine

## 2020-08-22 ENCOUNTER — Encounter (HOSPITAL_COMMUNITY): Payer: Self-pay

## 2020-08-22 ENCOUNTER — Other Ambulatory Visit: Payer: Self-pay

## 2020-08-22 ENCOUNTER — Emergency Department (HOSPITAL_COMMUNITY): Payer: BC Managed Care – PPO

## 2020-08-22 DIAGNOSIS — R42 Dizziness and giddiness: Secondary | ICD-10-CM | POA: Insufficient documentation

## 2020-08-22 DIAGNOSIS — Z79899 Other long term (current) drug therapy: Secondary | ICD-10-CM | POA: Diagnosis not present

## 2020-08-22 DIAGNOSIS — Z20822 Contact with and (suspected) exposure to covid-19: Secondary | ICD-10-CM | POA: Insufficient documentation

## 2020-08-22 DIAGNOSIS — Z7982 Long term (current) use of aspirin: Secondary | ICD-10-CM | POA: Diagnosis not present

## 2020-08-22 DIAGNOSIS — R0789 Other chest pain: Secondary | ICD-10-CM | POA: Diagnosis present

## 2020-08-22 DIAGNOSIS — Z8546 Personal history of malignant neoplasm of prostate: Secondary | ICD-10-CM | POA: Insufficient documentation

## 2020-08-22 DIAGNOSIS — R079 Chest pain, unspecified: Secondary | ICD-10-CM

## 2020-08-22 LAB — HEPATIC FUNCTION PANEL
ALT: 17 U/L (ref 0–44)
AST: 22 U/L (ref 15–41)
Albumin: 4.2 g/dL (ref 3.5–5.0)
Alkaline Phosphatase: 49 U/L (ref 38–126)
Bilirubin, Direct: 0.1 mg/dL (ref 0.0–0.2)
Indirect Bilirubin: 0.6 mg/dL (ref 0.3–0.9)
Total Bilirubin: 0.7 mg/dL (ref 0.3–1.2)
Total Protein: 7 g/dL (ref 6.5–8.1)

## 2020-08-22 LAB — RESP PANEL BY RT-PCR (FLU A&B, COVID) ARPGX2
Influenza A by PCR: NEGATIVE
Influenza B by PCR: NEGATIVE
SARS Coronavirus 2 by RT PCR: NEGATIVE

## 2020-08-22 LAB — BASIC METABOLIC PANEL
Anion gap: 7 (ref 5–15)
BUN: 15 mg/dL (ref 8–23)
CO2: 28 mmol/L (ref 22–32)
Calcium: 9.2 mg/dL (ref 8.9–10.3)
Chloride: 99 mmol/L (ref 98–111)
Creatinine, Ser: 1.28 mg/dL — ABNORMAL HIGH (ref 0.61–1.24)
GFR, Estimated: >60 mL/min (ref >60)
Glucose, Bld: 109 mg/dL — ABNORMAL HIGH (ref 70–99)
Potassium: 4 mmol/L (ref 3.5–5.1)
Sodium: 134 mmol/L — ABNORMAL LOW (ref 135–145)

## 2020-08-22 LAB — CBC
HCT: 39.9 % (ref 39.0–52.0)
Hemoglobin: 13.7 g/dL (ref 13.0–17.0)
MCH: 31.4 pg (ref 26.0–34.0)
MCHC: 34.3 g/dL (ref 30.0–36.0)
MCV: 91.5 fL (ref 80.0–100.0)
Platelets: 239 10*3/uL (ref 150–400)
RBC: 4.36 MIL/uL (ref 4.22–5.81)
RDW: 11.6 % (ref 11.5–15.5)
WBC: 10.9 10*3/uL — ABNORMAL HIGH (ref 4.0–10.5)
nRBC: 0 % (ref 0.0–0.2)

## 2020-08-22 LAB — BRAIN NATRIURETIC PEPTIDE: B Natriuretic Peptide: 95.8 pg/mL (ref 0.0–100.0)

## 2020-08-22 LAB — TROPONIN I (HIGH SENSITIVITY)
Troponin I (High Sensitivity): 2 ng/L (ref <18)
Troponin I (High Sensitivity): 3 ng/L (ref <18)

## 2020-08-22 NOTE — ED Provider Notes (Signed)
[facility] DEPT Provider Note   CSN: [id number] Arrival date & time: 08/22/20  1237     History Chief Complaint  Patient presents with  . Chest Pain    Justin Macias is a 60 y.o. male.  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  Patient reports being seen by his primary care doctor on 5/4, had an EKG done which was overall reassuring.  Patient reports continuing episodes of chest pressure and tightness, worse with exertion.  Denies any shortness of breath.  Denies any nausea, vomiting, radiation to the jaw or back.  Denies any fevers or chills.  No leg swelling.  No fevers, chills, nasal congestion, cough    Past Medical History:  Diagnosis Date  . Prostate cancer [facility])     Patient Active Problem List   Diagnosis Date Noted  . Malignant neoplasm of prostate ([place]) 08/25/2017  . Convulsions/seizures ([place]) 01/31/2013  . Syncope 01/31/2013  . Hyponatremia 01/31/2013    Past Surgical History:  Procedure Laterality Date  . ORCHIOPEXY    . PROSTATE SURGERY         Family History  Problem Relation Age of Onset  . Heart attack Father   . Prostate cancer Brother   . Colon cancer Mother   . Prostate cancer Maternal Uncle   . Colon cancer Brother     Social History   Tobacco Use  . Smoking status: Never Smoker  . Smokeless tobacco: Never Used  Vaping Use  . Vaping Use: Never used  Substance Use Topics  . Alcohol use: Yes    Comment: occasional  . Drug use: No    Home Medications Prior to Admission medications   Medication Sig Start Date End Date Taking? Authorizing Provider  amLODipine-olmesartan (AZOR) 5-20 MG tablet Take 1 tablet by mouth daily. 08/18/20  Yes [provider]  aspirin EC 81 MG  tablet Take 81 mg by mouth daily.   Yes [provider]  GARLIC PO Take 1 capsule by mouth daily.   Yes [provider]  Red Yeast Rice Extract (RED YEAST RICE PO) Take 1 capsule by mouth daily.   Yes [provider]  tiZANidine (ZANAFLEX) 2 MG tablet Take 1 tablet (2 mg total) by mouth every 6 (six) hours as needed for muscle spasms. 07/19/20  Yes Scot Jun, FNP    Allergies    Sulfa antibiotics  Review of Systems   Review of Systems  Constitutional: Negative for chills and fever.  HENT: Negative for ear pain and sore throat.   Eyes: Negative for pain and visual disturbance.  Respiratory: Positive for chest tightness. Negative for cough and shortness of breath.   Cardiovascular: Positive for chest pain. Negative for palpitations.  Gastrointestinal: Negative for abdominal pain and vomiting.  Genitourinary: Negative for dysuria and hematuria.  Musculoskeletal: Negative for arthralgias and back pain.  Skin: Negative for color change and rash.  Neurological: Positive for dizziness. Negative for seizures and syncope.  All other systems reviewed and are negative.   Physical Exam Updated Vital Signs BP (!) 153/96   Pulse 62   Temp 98.2 F (36.8 C) (Oral)   Resp 14   Ht 5\' 8"  (1.727 m)   Wt 68 kg   SpO2 100%   BMI  22.81 kg/m   Physical Exam Vitals and nursing note reviewed.  Constitutional:      Appearance: He is well-developed.  HENT:     Head: Normocephalic and atraumatic.  Eyes:     Conjunctiva/sclera: Conjunctivae normal.  Cardiovascular:     Rate and Rhythm: Normal rate and regular rhythm.     Heart sounds: Normal heart sounds. No murmur heard.   Pulmonary:     Effort: Pulmonary effort is normal. No respiratory distress.     Breath sounds: Normal breath sounds. No stridor.  Abdominal:     Palpations: Abdomen is soft.     Tenderness: There is no abdominal tenderness.  Musculoskeletal:     Cervical back: Neck supple.     Right  lower leg: No edema.     Left lower leg: No edema.  Skin:    General: Skin is warm and dry.  Neurological:     General: No focal deficit present.     Mental Status: He is alert.     Comments: Mental Status:  Alert, thought content appropriate, able to give a coherent history. Speech fluent without evidence of aphasia. Able to follow 2 step commands without difficulty.  Cranial Nerves:  II: Peripheral visual fields grossly normal, pupils equal, round, reactive to light III,IV, VI: ptosis not present, extra-ocular motions intact bilaterally  V,VII: smile symmetric, facial light touch sensation equal VIII: hearing grossly normal to voice  X: uvula elevates symmetrically  XI: bilateral shoulder shrug symmetric and strong XII: midline tongue extension without fassiculations Motor:  Normal tone. 5/5 strength of BUE and BLE major muscle groups including strong and equal grip strength and dorsiflexion/plantar flexion Sensory: light touch normal in all extremities. Cerebellar: normal finger-to-nose with bilateral upper extremities, Romberg sign absent Gait: normal gait and balance. Able to walk on toes and heels with ease.   Psychiatric:        Mood and Affect: Mood normal.        Behavior: Behavior normal.     ED Results / Procedures / Treatments   Labs (all labs ordered are listed, but only abnormal results are displayed) Labs Reviewed  BASIC METABOLIC PANEL - Abnormal; Notable for the following components:      Result Value   Sodium 134 (*)    Glucose, Bld 109 (*)    Creatinine, Ser 1.28 (*)    All other components within normal limits  CBC - Abnormal; Notable for the following components:   WBC 10.9 (*)    All other components within normal limits  RESP PANEL BY RT-PCR (FLU A&B, COVID) ARPGX2  BRAIN NATRIURETIC PEPTIDE  HEPATIC FUNCTION PANEL  TROPONIN I (HIGH SENSITIVITY)  TROPONIN I (HIGH SENSITIVITY)    EKG None  Radiology DG Chest 2 View  Result Date:  08/22/2020 CLINICAL DATA:  Chest pain. EXAM: CHEST - 2 VIEW COMPARISON:  January 31, 2013. FINDINGS: The heart size and mediastinal contours are within normal limits. Both lungs are clear. No visible pleural effusions or pneumothorax. No acute osseous abnormality. IMPRESSION: No active cardiopulmonary disease. Electronically Signed   By: Margaretha Sheffield MD   On: 08/22/2020 13:57    Procedures Procedures   Medications Ordered in ED Medications - No data to display  ED Course  I have reviewed the triage vital signs and the nursing notes.  Pertinent labs & imaging results that were available during my care of the patient were reviewed by me and considered in my medical decision making (see chart for  details).    MDM Rules/Calculators/A&P                         60 year old male who presents to the ER with complaints of ongoing chest tightness which is occasional,ongoing for the last month.  On arrival, very well-appearing, no acute distress, resting comfortably in ER bed.  Vitals with the elevated BPs, consistent with history, no other acute vital sign abnormalities.  Physical exam overall reassuring, lung sounds clear.  Labs and imaging ordered, reviewed and interpreted by me.  CBC with a mild leukocytosis of 10.9, likely nonspecific.  COVID and flu are negative.  BMP unremarkable.  EKG without ischemic changes.  Chest x-ray without any acute abnormalities.  Delta troponins are negative  Overall work-up reassuring, low suspicion of ACS, dissection, low suspicion for PE (patient with remote history of prostate cancer, not undergoing treatment at this time and is closely being monitored).  Patient would benefit from further cardiac work-up.  Referral provided.  We discussed return precautions.  He was understanding and is agreeable.  Stable for discharge  Final Clinical Impression(s) / ED Diagnoses Final diagnoses:  Chest pain, unspecified type    Rx / DC Orders ED Discharge Orders     None       Lyndel Safe 08/22/20 1612    Lucrezia Starch, MD 08/23/20 2124

## 2020-08-22 NOTE — ED Triage Notes (Signed)
Patient c/o chest pressure and dizziness since yesterday. Paitent states he gets SOB at times. Patient denies any N/V or diaphoresis.

## 2020-08-22 NOTE — Discharge Instructions (Addendum)
You were evaluated in the Emergency Department and after careful evaluation, we did not find any emergent condition requiring admission or further testing in the hospital.  Your work-up today was overall reassuring.  You would benefit from further evaluation by cardiology.  Please see the contact information in your discharge paperwork.  Please call to make a appointment.  Please return to the Emergency Department if you experience any worsening of your condition. Thank you for allowing [place] to be a part of your care.
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
## 2020-08-29 ENCOUNTER — Ambulatory Visit: Payer: BC Managed Care – PPO | Admitting: Cardiology

## 2020-08-29 ENCOUNTER — Encounter: Payer: Self-pay | Admitting: Cardiology

## 2020-08-29 ENCOUNTER — Other Ambulatory Visit: Payer: Self-pay

## 2020-08-29 VITALS — BP 141/92 | HR 70 | Ht 68.0 in | Wt 142.0 lb

## 2020-08-29 DIAGNOSIS — E782 Mixed hyperlipidemia: Secondary | ICD-10-CM

## 2020-08-29 DIAGNOSIS — I209 Angina pectoris, unspecified: Secondary | ICD-10-CM

## 2020-08-29 DIAGNOSIS — I10 Essential (primary) hypertension: Secondary | ICD-10-CM | POA: Insufficient documentation

## 2020-08-29 MED ORDER — ROSUVASTATIN CALCIUM 20 MG PO TABS
20.0000 mg | ORAL_TABLET | Freq: Every day | ORAL | 3 refills | Status: DC
Start: 2020-08-29 — End: 2020-10-23

## 2020-08-29 MED ORDER — NITROGLYCERIN 0.4 MG SL SUBL: 0.4000 mg | SUBLINGUAL_TABLET | SUBLINGUAL | 3 refills | Status: AC | PRN

## 2020-08-29 NOTE — Addendum Note (Signed)
Addended by: Nigel Mormon on: 08/29/2020 11:52 AM   Modules accepted: Orders

## 2020-09-11 ENCOUNTER — Ambulatory Visit: Payer: BC Managed Care – PPO

## 2020-09-11 ENCOUNTER — Other Ambulatory Visit: Payer: Self-pay

## 2020-09-11 DIAGNOSIS — I209 Angina pectoris, unspecified: Secondary | ICD-10-CM

## 2020-09-15 ENCOUNTER — Other Ambulatory Visit: Payer: Self-pay | Admitting: Cardiology

## 2020-09-15 DIAGNOSIS — I209 Angina pectoris, unspecified: Secondary | ICD-10-CM

## 2020-09-18 ENCOUNTER — Other Ambulatory Visit: Payer: Self-pay

## 2020-09-18 ENCOUNTER — Ambulatory Visit: Payer: BC Managed Care – PPO

## 2020-09-18 DIAGNOSIS — I209 Angina pectoris, unspecified: Secondary | ICD-10-CM

## 2020-09-19 NOTE — Progress Notes (Signed)
Called and spoke with patient regarding his stress test results.

## 2020-09-28 ENCOUNTER — Ambulatory Visit: Payer: Self-pay | Admitting: Cardiology

## 2020-10-14 ENCOUNTER — Other Ambulatory Visit: Payer: Self-pay

## 2020-10-14 ENCOUNTER — Ambulatory Visit
Admission: EM | Admit: 2020-10-14 | Discharge: 2020-10-14 | Disposition: A | Discharge status: Home / Self Care | Payer: BC Managed Care – PPO | Attending: Emergency Medicine | Admitting: Emergency Medicine

## 2020-10-14 DIAGNOSIS — J069 Acute upper respiratory infection, unspecified: Secondary | ICD-10-CM | POA: Diagnosis not present

## 2020-10-14 MED ORDER — BENZONATATE 200 MG PO CAPS: 200.0000 mg | ORAL_CAPSULE | Freq: Three times a day (TID) | ORAL | 0 refills | Status: AC | PRN

## 2020-10-14 MED ORDER — DM-GUAIFENESIN ER 30-600 MG PO TB12: 1.0000 | ORAL_TABLET | Freq: Two times a day (BID) | ORAL | 0 refills | Status: AC

## 2020-10-14 MED ORDER — CETIRIZINE HCL 10 MG PO CAPS: 10.0000 mg | ORAL_CAPSULE | Freq: Every day | ORAL | 0 refills | Status: AC

## 2020-10-14 NOTE — Discharge Instructions (Addendum)
COVID test pending, monitor MyChart for results Rest and fluids Tylenol and ibuprofen as needed for any headaches, fevers Daily cetirizine help with postnasal drainage Mucinex DM twice daily to further help with congestion and cough Tessalon every 8 hours for cough as needed Honey and hot tea Follow-up with nonimprovement or worsening

## 2020-10-14 NOTE — ED Provider Notes (Signed)
[facility]    CSN: [id number] Arrival date & time: 10/14/20  1355      History   Chief Complaint Chief Complaint  Patient presents with   Cough    HPI Justin Macias is a 60 y.o. male history of prostate cancer, hypertension, presenting today for evaluation of URI symptoms.  Reports associated cough and congestion.  Symptoms began 2 days ago.  Reports a lot of postnasal drainage and throat irritation.  Denies significant rhinorrhea.  Denies fevers chills or body aches.  Denies any nausea vomiting diarrhea or abdominal pain.  Declines using any medicines.  HPI  Past Medical History:  Diagnosis Date   Prostate cancer [facility])     Patient Active Problem List   Diagnosis Date Noted   Angina pectoris ([place]) 08/29/2020   Essential hypertension 08/29/2020   Malignant neoplasm of prostate ([place]) 08/25/2017   Convulsions/seizures ([place]) 01/31/2013   Syncope 01/31/2013   Hyponatremia 01/31/2013    Past Surgical History:  Procedure Laterality Date   ORCHIOPEXY     PROSTATE SURGERY         Home Medications    Prior to Admission medications   Medication Sig Start Date End Date Taking? Authorizing Provider  benzonatate (TESSALON) 200 MG capsule Take 1 capsule (200 mg total) by mouth 3 (three) times daily as needed for up to 7 days for cough. 10/14/20 10/21/20 Yes Trace Cederberg C, PA-C  Cetirizine HCl 10 MG CAPS Take 1 capsule (10 mg total) by mouth daily for 10 days. 10/14/20 10/24/20 Yes Hiba Garry C, PA-C  dextromethorphan-guaiFENesin (MUCINEX DM) 30-600 MG 12hr tablet Take 1 tablet by mouth 2 (two) times daily. 10/14/20  Yes Charizma Gardiner C, PA-C  amLODipine-olmesartan (AZOR) 5-20 MG tablet Take 1 tablet by mouth daily. 08/18/20   [provider]  aspirin EC 81 MG tablet Take 81 mg by mouth daily.    [provider]  GARLIC PO Take 1 capsule by mouth daily.    [provider]  nitroGLYCERIN (NITROSTAT) 0.4 MG SL tablet Place 1 tablet (0.4 mg  total) under the tongue every 5 (five) minutes as needed for chest pain. 08/29/20 11/27/20  Patwardhan, Reynold Bowen, MD  Red Yeast Rice Extract (RED YEAST RICE PO) Take 1 capsule by mouth daily.    [provider]  rosuvastatin (CRESTOR) 20 MG tablet Take 1 tablet (20 mg total) by mouth daily. 08/29/20 11/27/20  Nigel Mormon, MD    Family History Family History  Problem Relation Age of Onset   Heart attack Father    Prostate cancer Brother    Colon cancer Mother    Prostate cancer Maternal Uncle    Colon cancer Brother     Social History Social History   Tobacco Use   Smoking status: Never   Smokeless tobacco: Never  Vaping Use   Vaping Use: Never used  Substance Use Topics   Alcohol use: Yes    Comment: occasional   Drug use: No     Allergies   Sulfa antibiotics   Review of Systems Review of Systems  Constitutional:  Negative for activity change, appetite change, chills, fatigue and fever.  HENT:  Positive for congestion and postnasal drip. Negative for ear pain, rhinorrhea, sinus pressure, sore throat and trouble swallowing.   Eyes:  Negative for discharge and redness.  Respiratory:  Positive for cough. Negative for chest tightness and shortness of breath.   Cardiovascular:  Negative for chest pain.  Gastrointestinal:  Negative for  abdominal pain, diarrhea, nausea and vomiting.  Musculoskeletal:  Negative for myalgias.  Skin:  Negative for rash.  Neurological:  Negative for dizziness, light-headedness and headaches.    Physical Exam Triage Vital Signs ED Triage Vitals  Enc Vitals Group     BP      Pulse      Resp      Temp      Temp src      SpO2      Weight      Height      Head Circumference      Peak Flow      Pain Score      Pain Loc      Pain Edu?      Excl. in [place]?    No data found.  Updated Vital Signs BP 127/80 (BP Location: Left Arm)   Pulse 92   Temp 100.3 F (37.9 C) (Oral)   Resp 18   SpO2 95%   Visual Acuity Right  Eye Distance:   Left Eye Distance:   Bilateral Distance:    Right Eye Near:   Left Eye Near:    Bilateral Near:     Physical Exam Vitals and nursing note reviewed.  Constitutional:      Appearance: He is well-developed.     Comments: No acute distress  HENT:     Head: Normocephalic and atraumatic.     Ears:     Comments: Bilateral ears without tenderness to palpation of external auricle, tragus and mastoid, EAC's without erythema or swelling, TM's with good bony landmarks and cone of light. Non erythematous.      Nose: Nose normal.     Mouth/Throat:     Comments: Oral mucosa pink and moist, no tonsillar enlargement or exudate. Posterior pharynx patent and nonerythematous, no uvula deviation or swelling. Normal phonation.  Eyes:     Conjunctiva/sclera: Conjunctivae normal.  Cardiovascular:     Rate and Rhythm: Normal rate and regular rhythm.  Pulmonary:     Effort: Pulmonary effort is normal. No respiratory distress.     Comments: Breathing comfortably at rest, CTABL, no wheezing, rales or other adventitious sounds auscultated   Abdominal:     General: There is no distension.  Musculoskeletal:        General: Normal range of motion.     Cervical back: Neck supple.  Skin:    General: Skin is warm and dry.  Neurological:     Mental Status: He is alert and oriented to person, place, and time.     UC Treatments / Results  Labs (all labs ordered are listed, but only abnormal results are displayed) Labs Reviewed  NOVEL CORONAVIRUS, NAA   Narrative:    Performed at:  [address], [place], [place]  [id number] Lab Director: Rush Farmer MD, Phone:  [phone]  SARS-COV-2, NAA 2 DAY TAT   Narrative:    Performed at:  [facility] [address] [place], [place]  [id number] Lab Director: Rush Farmer MD, Phone:  [phone]    EKG   Radiology No results found.  Procedures Procedures (including critical care  time)  Medications Ordered in UC Medications - No data to display  Initial Impression / Assessment and Plan / UC Course  I have reviewed the triage vital signs and the nursing notes.  Pertinent labs & imaging results that were available during my care of the patient were reviewed by me and considered in  my medical decision making (see chart for details).     URI with cough-symptoms x2 days, exam reassuring, low-grade fever in clinic today, recommend symptomatic and supportive care, COVID is pending for screening, recommendations below.  Discussed strict return precautions. Patient verbalized understanding and is agreeable with plan.  Final Clinical Impressions(s) / UC Diagnoses   Final diagnoses:  Viral URI with cough     Discharge Instructions      COVID test pending, monitor MyChart for results Rest and fluids Tylenol and ibuprofen as needed for any headaches, fevers Daily cetirizine help with postnasal drainage Mucinex DM twice daily to further help with congestion and cough Tessalon every 8 hours for cough as needed Honey and hot tea Follow-up with nonimprovement or worsening     ED Prescriptions     Medication Sig Dispense Auth. Provider   benzonatate (TESSALON) 200 MG capsule Take 1 capsule (200 mg total) by mouth 3 (three) times daily as needed for up to 7 days for cough. 28 capsule Emma Birchler C, PA-C   dextromethorphan-guaiFENesin (MUCINEX DM) 30-600 MG 12hr tablet Take 1 tablet by mouth 2 (two) times daily. 16 tablet Latonga Ponder C, PA-C   Cetirizine HCl 10 MG CAPS Take 1 capsule (10 mg total) by mouth daily for 10 days. 10 capsule Terricka Onofrio, Jefferson C, PA-C      PDMP not reviewed this encounter.   Janith Lima, [place] 10/16/20 [phone]

## 2020-10-14 NOTE — ED Triage Notes (Signed)
Two day h/o cough and post nasal drip. No meds taken. No abdominal pain, n/v/d.

## 2020-10-15 LAB — NOVEL CORONAVIRUS, NAA: SARS-CoV-2, NAA: NOT DETECTED

## 2020-10-15 LAB — SARS-COV-2, NAA 2 DAY TAT

## 2020-10-22 ENCOUNTER — Other Ambulatory Visit: Payer: Self-pay | Admitting: Cardiology

## 2020-10-22 DIAGNOSIS — E782 Mixed hyperlipidemia: Secondary | ICD-10-CM

## 2020-10-23 ENCOUNTER — Ambulatory Visit
Admission: RE | Admit: 2020-10-23 | Discharge: 2020-10-23 | Disposition: A | Discharge status: Home / Self Care | Payer: BC Managed Care – PPO | Source: Ambulatory Visit | Attending: Cardiology | Admitting: Cardiology

## 2020-10-23 DIAGNOSIS — I209 Angina pectoris, unspecified: Secondary | ICD-10-CM

## 2020-10-24 ENCOUNTER — Ambulatory Visit: Payer: BC Managed Care – PPO | Admitting: Internal Medicine
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
## 2020-11-01 ENCOUNTER — Ambulatory Visit: Payer: BC Managed Care – PPO | Admitting: Cardiology

## 2020-11-01 ENCOUNTER — Encounter: Payer: Self-pay | Admitting: Cardiology

## 2020-11-01 ENCOUNTER — Other Ambulatory Visit: Payer: Self-pay

## 2020-11-01 VITALS — BP 124/76 | HR 85 | Temp 98.5°F | Resp 16 | Ht 68.0 in | Wt 153.0 lb

## 2020-11-01 DIAGNOSIS — E782 Mixed hyperlipidemia: Secondary | ICD-10-CM | POA: Insufficient documentation

## 2020-11-01 DIAGNOSIS — I10 Essential (primary) hypertension: Secondary | ICD-10-CM

## 2021-03-29 NOTE — Progress Notes (Signed)
Called the patient and left voicemail. Encouraged him to take Crestor and f/u w/PCP.  Nigel Mormon, MD

## 2021-04-03 NOTE — Progress Notes (Signed)
Called and spoke to pt, pt voiced understanding.

## 2021-05-24 DIAGNOSIS — Z86010 Personal history of colonic polyps: Secondary | ICD-10-CM | POA: Diagnosis not present

## 2021-05-24 DIAGNOSIS — K219 Gastro-esophageal reflux disease without esophagitis: Secondary | ICD-10-CM | POA: Diagnosis not present

## 2021-06-26 DIAGNOSIS — E7800 Pure hypercholesterolemia, unspecified: Secondary | ICD-10-CM | POA: Diagnosis not present

## 2021-06-27 ENCOUNTER — Encounter (HOSPITAL_COMMUNITY): Payer: Self-pay

## 2021-06-27 ENCOUNTER — Emergency Department (HOSPITAL_COMMUNITY)
Admission: EM | Admit: 2021-06-27 | Discharge: 2021-06-27 | Disposition: A | Discharge status: Home / Self Care | Payer: BC Managed Care – PPO | Attending: Emergency Medicine | Admitting: Emergency Medicine

## 2021-06-27 ENCOUNTER — Ambulatory Visit
Admission: EM | Admit: 2021-06-27 | Discharge: 2021-06-27 | Disposition: A | Discharge status: Home / Self Care | Payer: BC Managed Care – PPO | Source: Home / Self Care

## 2021-06-27 ENCOUNTER — Other Ambulatory Visit: Payer: Self-pay

## 2021-06-27 ENCOUNTER — Encounter: Payer: Self-pay | Admitting: Emergency Medicine

## 2021-06-27 ENCOUNTER — Emergency Department (HOSPITAL_COMMUNITY): Payer: BC Managed Care – PPO

## 2021-06-27 DIAGNOSIS — R079 Chest pain, unspecified: Secondary | ICD-10-CM | POA: Insufficient documentation

## 2021-06-27 DIAGNOSIS — Z79899 Other long term (current) drug therapy: Secondary | ICD-10-CM | POA: Insufficient documentation

## 2021-06-27 DIAGNOSIS — R1013 Epigastric pain: Secondary | ICD-10-CM

## 2021-06-27 DIAGNOSIS — R0789 Other chest pain: Secondary | ICD-10-CM | POA: Insufficient documentation

## 2021-06-27 DIAGNOSIS — R066 Hiccough: Secondary | ICD-10-CM | POA: Insufficient documentation

## 2021-06-27 LAB — CBC
HCT: 38.5 % — ABNORMAL LOW (ref 39.0–52.0)
Hemoglobin: 12.8 g/dL — ABNORMAL LOW (ref 13.0–17.0)
MCH: 30.6 pg (ref 26.0–34.0)
MCHC: 33.2 g/dL (ref 30.0–36.0)
MCV: 92.1 fL (ref 80.0–100.0)
Platelets: 209 10*3/uL (ref 150–400)
RBC: 4.18 MIL/uL — ABNORMAL LOW (ref 4.22–5.81)
RDW: 11.9 % (ref 11.5–15.5)
WBC: 8.8 10*3/uL (ref 4.0–10.5)
nRBC: 0 % (ref 0.0–0.2)

## 2021-06-27 LAB — TROPONIN I (HIGH SENSITIVITY)
Troponin I (High Sensitivity): 5 ng/L (ref <18)
Troponin I (High Sensitivity): 5 ng/L (ref <18)

## 2021-06-27 LAB — BASIC METABOLIC PANEL
Anion gap: 9 (ref 5–15)
BUN: 13 mg/dL (ref 8–23)
CO2: 25 mmol/L (ref 22–32)
Calcium: 9.2 mg/dL (ref 8.9–10.3)
Chloride: 104 mmol/L (ref 98–111)
Creatinine, Ser: 1.19 mg/dL (ref 0.61–1.24)
GFR, Estimated: >60 mL/min (ref >60)
Glucose, Bld: 102 mg/dL — ABNORMAL HIGH (ref 70–99)
Potassium: 4.1 mmol/L (ref 3.5–5.1)
Sodium: 138 mmol/L (ref 135–145)

## 2021-06-27 NOTE — ED Triage Notes (Signed)
Pt arrived POV from urgent care c/o having hiccups for 4 days and some chest discomfort. Pt states the hiccups went away and he thinks he is just sore from having the hiccups for so long. Pt states urgent care was concerned about his hx and wanted further CP workup.  ?

## 2021-06-27 NOTE — ED Provider Notes (Signed)
?[facility] ? ? ? ?CSN: [id number] ?Arrival date & time: 06/27/21  1032 ? ? ?  ? ?History   ?Chief Complaint ?Chief Complaint  ?Patient presents with  ? Heartburn  ? ? ?HPI ?Justin Macias is a 61 y.o. male.  ? ?Patient here today for evaluation of central chest discomfort. He reports the last few nights he has had bad hiccups and is not sure if this is the cause of his symptoms. He does have history of angina as well as hypertension, hyperlipidemia.  ? ?The history is provided by the patient.  ?Heartburn ?Associated symptoms include chest pain. Pertinent negatives include no shortness of breath.  ? ?Past Medical History:  ?Diagnosis Date  ? Prostate cancer ([place])   ? ? ?Patient Active Problem List  ? Diagnosis Date Noted  ? Mixed hyperlipidemia 11/01/2020  ? Angina pectoris ([place]) 08/29/2020  ? Essential hypertension 08/29/2020  ? Malignant neoplasm of prostate ([place]) 08/25/2017  ? Convulsions/seizures ([place]) 01/31/2013  ? Syncope 01/31/2013  ? Hyponatremia 01/31/2013  ? ? ?Past Surgical History:  ?Procedure Laterality Date  ? ORCHIOPEXY    ? PROSTATE SURGERY    ? ? ? ? ? ?Home Medications   ? ?Prior to Admission medications   ?Medication Sig Start Date End Date Taking? Authorizing Provider  ?amLODipine-olmesartan (AZOR) 5-20 MG tablet Take 1 tablet by mouth daily. 08/18/20   [provider]  ?Cetirizine HCl 10 MG CAPS Take 1 capsule (10 mg total) by mouth daily for 10 days. 10/14/20 11/01/20  Wieters, Hallie C, PA-C  ?dextromethorphan-guaiFENesin (MUCINEX DM) 30-600 MG 12hr tablet Take 1 tablet by mouth 2 (two) times daily. 10/14/20   Wieters, Hallie C, PA-C  ?GARLIC PO Take 1 capsule by mouth daily.    [provider]  ?nitroGLYCERIN (NITROSTAT) 0.4 MG SL tablet Place 1 tablet (0.4 mg total) under the tongue every 5 (five) minutes as needed for chest pain. 08/29/20 11/27/20  Patwardhan, Reynold Bowen, MD  ?Red Yeast Rice Extract (RED YEAST RICE PO) Take 1 capsule by mouth daily.    [provider]  ?rosuvastatin (CRESTOR) 20 MG tablet TAKE 1 TABLET BY MOUTH EVERY DAY 10/23/20   Patwardhan, Reynold Bowen, MD  ? ? ?Family History ?Family History  ?Problem Relation Age of Onset  ? Heart attack Father   ? Prostate cancer Brother   ? Colon cancer Mother   ? Prostate cancer Maternal Uncle   ? Colon cancer Brother   ? ? ?Social History ?Social History  ? ?Tobacco Use  ? Smoking status: Never  ? Smokeless tobacco: Never  ?Vaping Use  ? Vaping Use: Never used  ?Substance Use Topics  ? Alcohol use: Yes  ?  Comment: occasional  ? Drug use: No  ? ? ? ?Allergies   ?Sulfa antibiotics ? ? ?Review of Systems ?Review of Systems  ?Constitutional:  Negative for chills and fever.  ?Eyes:  Negative for discharge and redness.  ?Respiratory:  Negative for shortness of breath.   ?Cardiovascular:  Positive for chest pain.  ?Gastrointestinal:  Positive for heartburn.  ? ? ?Physical Exam ?Triage Vital Signs ?ED Triage Vitals  ?Enc Vitals Group  ?   BP 06/27/21 1041 134/86  ?   Pulse Rate 06/27/21 1041 83  ?   Resp 06/27/21 1041 18  ?   Temp 06/27/21 1041 98.3 ?F (36.8 ?C)  ?   Temp Source 06/27/21 1041 Oral  ?   SpO2 06/27/21 1041 96 %  ?  Weight --   ?   Height --   ?   Head Circumference --   ?   Peak Flow --   ?   Pain Score 06/27/21 1039 4  ?   Pain Loc --   ?   Pain Edu? --   ?   Excl. in [place]? --   ? ?No data found. ? ?Updated Vital Signs ?BP 134/86   Pulse 83   Temp 98.3 ?F (36.8 ?C) (Oral)   Resp 18   SpO2 96%  ?  ? ?Physical Exam ?Vitals and nursing note reviewed.  ?Constitutional:   ?   General: He is not in acute distress. ?   Appearance: Normal appearance. He is obese. He is not ill-appearing.  ?HENT:  ?   Head: Normocephalic and atraumatic.  ?Eyes:  ?   Conjunctiva/sclera: Conjunctivae normal.  ?Cardiovascular:  ?   Rate and Rhythm: Normal rate and regular rhythm.  ?Pulmonary:  ?   Effort: Pulmonary effort is normal. No respiratory distress.  ?   Breath sounds: Normal breath sounds. No wheezing, rhonchi or rales.   ?Neurological:  ?   Mental Status: He is alert.  ? ? ? ?UC Treatments / Results  ?Labs ?(all labs ordered are listed, but only abnormal results are displayed) ?Labs Reviewed - No data to display ? ?EKG ? ? ?Radiology ?No results found. ? ?Procedures ?Procedures (including critical care time) ? ?Medications Ordered in UC ?Medications - No data to display ? ?Initial Impression / Assessment and Plan / UC Course  ?I have reviewed the triage vital signs and the nursing notes. ? ?Pertinent labs & imaging results that were available during my care of the patient were reviewed by me and considered in my medical decision making (see chart for details). ? ? Given patient's risk factors recommended further evaluation in the ED for stat labs, etc. Patient is agreeable with same. Feels stable to transport himself via POV.  ? ? ?Final Clinical Impressions(s) / UC Diagnoses  ? ?Final diagnoses:  ?Chest pain, unspecified type  ? ?Discharge Instructions   ?None ?  ? ?ED Prescriptions   ?None ?  ? ?PDMP not reviewed this encounter. ?  ?Francene Finders, PA-C ?06/27/21 1107 ? ?

## 2021-06-27 NOTE — ED Triage Notes (Signed)
Pt is present today with central chest discomfort. Pt states that he noticed th discomfort sunday. Pt states he took Pepcid today and states that it helped a little  ?

## 2021-06-27 NOTE — ED Provider Triage Note (Signed)
Emergency Medicine Provider Triage Evaluation Note ? ?Arletha Pili , a 61 y.o. male  was evaluated in triage.  Pt complains of hiccups and chest pain for 4 days.  Patient states that the hiccups went away, and he thinks he is just sore from having for so long.  He initially presented to urgent care, and they were concerned about his history and wanted him to have further cardiac work-up.  He states he still having some central chest discomfort, but it is significantly better than it was yesterday.  He is scheduled to have an endoscopy in May to investigate his likely esophageal spasms. ? ?Review of Systems  ?Positive: Hiccups, chest discomfort ?Negative: Shortness of breath, fever, cough ? ?Physical Exam  ?BP (!) 141/85   Pulse 79   Temp 98.9 ?F (37.2 ?C) (Oral)   Resp (!) 22   Ht '5\' 7"'$  (1.702 m)   Wt 70.8 kg   SpO2 100%   BMI 24.43 kg/m?  ?Gen:   Awake, no distress   ?Resp:  Normal effort  ?MSK:   Moves extremities without difficulty  ?Other:   ? ?Medical Decision Making  ?Medically screening exam initiated at 12:35 PM.  Appropriate orders placed.  Arletha Pili was informed that the remainder of the evaluation will be completed by another provider, this initial triage assessment does not replace that evaluation, and the importance of remaining in the ED until their evaluation is complete. ? ? ?  ?Kateri Plummer, PA-C ?06/27/21 1238 ? ?

## 2021-06-27 NOTE — ED Provider Notes (Signed)
?[facility] ?Provider Note ? ? ?CSN: [id number] ?Arrival date & time: 06/27/21  1127 ? ?  ? ?History ? ?Chief Complaint  ?Patient presents with  ? Chest Pain  ? ? ?Justin Macias is a 61 y.o. male present emergency department with chest discomfort.  The patient reports that he had a bad case of hiccups about 5 days ago, lasting 2 days, or he had severe hiccuping all day and at night he could not sleep.  Patient subsequently his epigastrium and lower chest began to feel sore.  He went to urgent care today for an evaluation and had an EKG and was referred to the ED for further evaluation.  He reports has very minimal symptoms now, his hiccups have improved and gone away.  He denies abdominal tenderness, nausea, vomiting.  He is having regular bowel movements, he is passing gas.  He reports a history of an abdominal hernia surgery repair in the past but no other abdominal surgeries.  He does have a GI appointment scheduled in approximate 1 month for an upper endoscopy because of these episodes of hiccups.  He was started on Pepcid yesterday and is taking it and feels he is getting some relief of his discomfort at home.  He denies any personal history of MI or coronary disease, denies history of smoking, but does report history of hypertension, high cholesterol, and significant family history of coronary disease and MI in his father in his early 36s.  He does see a cardiologist. ? ?HPI ? ?  ? ?Home Medications ?Prior to Admission medications   ?Medication Sig Start Date End Date Taking? Authorizing Provider  ?amLODipine-olmesartan (AZOR) 5-20 MG tablet Take 1 tablet by mouth daily. 08/18/20   [provider]  ?Cetirizine HCl 10 MG CAPS Take 1 capsule (10 mg total) by mouth daily for 10 days. 10/14/20 11/01/20  Wieters, Hallie C, PA-C  ?dextromethorphan-guaiFENesin (MUCINEX DM) 30-600 MG 12hr tablet Take 1 tablet by mouth 2 (two) times daily. 10/14/20   Wieters, Hallie C, PA-C   ?GARLIC PO Take 1 capsule by mouth daily.    [provider]  ?nitroGLYCERIN (NITROSTAT) 0.4 MG SL tablet Place 1 tablet (0.4 mg total) under the tongue every 5 (five) minutes as needed for chest pain. 08/29/20 11/27/20  Patwardhan, Reynold Bowen, MD  ?Red Yeast Rice Extract (RED YEAST RICE PO) Take 1 capsule by mouth daily.    [provider]  ?rosuvastatin (CRESTOR) 20 MG tablet TAKE 1 TABLET BY MOUTH EVERY DAY 10/23/20   Patwardhan, Reynold Bowen, MD  ?   ? ?Allergies    ?Sulfa antibiotics   ? ?Review of Systems   ?Review of Systems ? ?Physical Exam ?Updated Vital Signs ?BP (!) 150/94   Pulse 79   Temp 98.9 ?F (37.2 ?C) (Oral)   Resp 17   Ht '5\' 7"'$  (1.702 m)   Wt 70.8 kg   SpO2 99%   BMI 24.43 kg/m?  ?Physical Exam ?Constitutional:   ?   General: He is not in acute distress. ?HENT:  ?   Head: Normocephalic and atraumatic.  ?Eyes:  ?   Conjunctiva/sclera: Conjunctivae normal.  ?   Pupils: Pupils are equal, round, and reactive to light.  ?Cardiovascular:  ?   Rate and Rhythm: Normal rate and regular rhythm.  ?Pulmonary:  ?   Effort: Pulmonary effort is normal. No respiratory distress.  ?Abdominal:  ?   General: There is no distension.  ?   Tenderness: There  is no abdominal tenderness.  ?Skin: ?   General: Skin is warm and dry.  ?Neurological:  ?   General: No focal deficit present.  ?   Mental Status: He is alert. Mental status is at baseline.  ?Psychiatric:     ?   Mood and Affect: Mood normal.     ?   Behavior: Behavior normal.  ? ? ?ED Results / Procedures / Treatments   ?Labs ?(all labs ordered are listed, but only abnormal results are displayed) ?Labs Reviewed  ?BASIC METABOLIC PANEL - Abnormal; Notable for the following components:  ?    Result Value  ? Glucose, Bld 102 (*)   ? All other components within normal limits  ?CBC - Abnormal; Notable for the following components:  ? RBC 4.18 (*)   ? Hemoglobin 12.8 (*)   ? HCT 38.5 (*)   ? All other components within normal limits  ?TROPONIN I (HIGH  SENSITIVITY)  ?TROPONIN I (HIGH SENSITIVITY)  ? ? ?EKG ?None ? ?Radiology ?DG Chest 2 View ? ?Result Date: 06/27/2021 ?CLINICAL DATA:  Chest pain EXAM: CHEST - 2 VIEW COMPARISON:  Chest x-ray dated Aug 22, 2020 FINDINGS: The heart size and mediastinal contours are within normal limits. Both lungs are clear. The visualized skeletal structures are unremarkable. IMPRESSION: No active cardiopulmonary disease. Electronically Signed   By: Yetta Glassman M.D.   On: 06/27/2021 13:06   ? ?Procedures ?Procedures  ? ? ?Medications Ordered in ED ?Medications - No data to display ? ?ED Course/ Medical Decision Making/ A&P ?  ?                        ?Medical Decision Making ?Amount and/or Complexity of Data Reviewed ?Labs: ordered. ?Radiology: ordered. ? ? ?Patient is here with hiccups and epigastric discomfort and pressure after several days.  His symptoms improved with Pepcid.  I strongly suspect that this may be GI related, including gastritis, and have a lower suspicion at this time for ACS or coronary disease.  I personally reviewed and interpreted his EKG which showed a normal sinus rhythm with no acute ischemic findings today, his troponins which were low and flat, and his chest x-ray which is unremarkable, did not show evidence of diaphragm paralysis or pneumonia.  He has no fever or leukocytosis.  He has no abdominal tenderness to suggest acute biliary disease or intra-abdominal infection or irritation of the diaphragm.  He has no other neurological symptoms to suggest stroke as an etiology of his hiccups.  He has no active chest discomfort or pain.  I recommend that he follow-up with both GI, that he continue his Pepcid at home. ? ?I reviewed his external records including cardiology stress test performed in June 2022 which showed no significant abnormalities per Dr Bonney Roussel note (09/18/20).  Echo same month 09/11/20 showing normal EF and no wall motion abnormalities. ? ? ? ? ? ? ? ?Final Clinical Impression(s) / ED  Diagnoses ?Final diagnoses:  ?Hiccups  ?Epigastric pain  ? ? ?Rx / DC Orders ?ED Discharge Orders   ? ? None  ? ?  ? ? ?  ?Wyvonnia Dusky, MD ?06/27/21 1621 ? ?

## 2021-06-27 NOTE — Discharge Instructions (Signed)
Please continue taking the Pepcid twice a day, 30 minutes for breakfast and dinner, which should help with irritated stomach.  Follow-up with your primary care doctor or your gastroenterologist for these hiccups. ? ?If you have new or worsening chest pain or pressure, difficulty breathing, lightheadedness, or loss of consciousness, please call 911 and come back to the emergency department ?

## 2021-07-17 ENCOUNTER — Other Ambulatory Visit: Payer: Self-pay | Admitting: Cardiology

## 2021-07-17 DIAGNOSIS — E782 Mixed hyperlipidemia: Secondary | ICD-10-CM

## 2021-08-13 DIAGNOSIS — K319 Disease of stomach and duodenum, unspecified: Secondary | ICD-10-CM | POA: Diagnosis not present

## 2021-08-13 DIAGNOSIS — K2970 Gastritis, unspecified, without bleeding: Secondary | ICD-10-CM | POA: Diagnosis not present

## 2021-08-13 DIAGNOSIS — K219 Gastro-esophageal reflux disease without esophagitis: Secondary | ICD-10-CM | POA: Diagnosis not present

## 2021-08-13 DIAGNOSIS — K2289 Other specified disease of esophagus: Secondary | ICD-10-CM | POA: Diagnosis not present

## 2021-10-03 DIAGNOSIS — K2970 Gastritis, unspecified, without bleeding: Secondary | ICD-10-CM | POA: Diagnosis not present

## 2021-10-03 DIAGNOSIS — Z86010 Personal history of colonic polyps: Secondary | ICD-10-CM | POA: Diagnosis not present

## 2021-10-03 DIAGNOSIS — K2100 Gastro-esophageal reflux disease with esophagitis, without bleeding: Secondary | ICD-10-CM | POA: Diagnosis not present

## 2021-10-03 DIAGNOSIS — Z800 Family history of malignant neoplasm of digestive organs: Secondary | ICD-10-CM | POA: Diagnosis not present

## 2022-01-29 ENCOUNTER — Ambulatory Visit: Payer: BC Managed Care – PPO | Admitting: Podiatrist

## 2022-01-29 DIAGNOSIS — L84 Corns and callosities: Secondary | ICD-10-CM | POA: Diagnosis not present

## 2022-01-29 NOTE — Progress Notes (Signed)
  Chief Complaint  Patient presents with   [place] / CALLOUS ON THE INSIDE OF GREAT TOE, HAS BEEN TRIMMED BEFORE BY PCP, PAIN STARTED A FEW MONTHS AGO     HPI: Patient is 61 y.o. male who presents today for callus on the medial side of the right second toe. She has had it trimmed in the past but the callused skin continues to return. She has pain that started in this toe as well.   Patient Active Problem List   Diagnosis Date Noted   Mixed hyperlipidemia 11/01/2020   Angina pectoris ([place]) 08/29/2020   Essential hypertension 08/29/2020   Malignant neoplasm of prostate ([place]) 08/25/2017   Convulsions/seizures ([place]) 01/31/2013   Syncope 01/31/2013   Hyponatremia 01/31/2013    Current Outpatient Medications on File Prior to Visit  Medication Sig Dispense Refill   amLODipine-olmesartan (AZOR) 5-20 MG tablet Take 1 tablet by mouth daily.     dextromethorphan-guaiFENesin (MUCINEX DM) 30-600 MG 12hr tablet Take 1 tablet by mouth 2 (two) times daily. 16 tablet 0   GARLIC PO Take 1 capsule by mouth daily.     Red Yeast Rice Extract (RED YEAST RICE PO) Take 1 capsule by mouth daily.     rosuvastatin (CRESTOR) 20 MG tablet TAKE 1 TABLET BY MOUTH EVERY DAY 90 tablet 2   Cetirizine HCl 10 MG CAPS Take 1 capsule (10 mg total) by mouth daily for 10 days. 10 capsule 0   nitroGLYCERIN (NITROSTAT) 0.4 MG SL tablet Place 1 tablet (0.4 mg total) under the tongue every 5 (five) minutes as needed for chest pain. 30 tablet 3   No current facility-administered medications on file prior to visit.    Allergies  Allergen Reactions   Sulfa Antibiotics Rash    Review of Systems No fevers, chills, nausea, muscle aches, no difficulty breathing, no calf pain, no chest pain or shortness of breath.   Physical Exam  GENERAL APPEARANCE: Alert, conversant. Appropriately groomed. No acute distress.   VASCULAR: Pedal pulses palpable 2/4 DP and 2/4 PT bilateral.  Capillary refill time  is immediate to all digits,  Proximal to distal cooling is warm to warm.  Digital perfusion adequate.   NEUROLOGIC: sensation is intact to 5.07 monofilament at 5/5 sites bilateral.  Light touch is intact bilateral, vibratory sensation intact bilateral  MUSCULOSKELETAL: acceptable muscle strength, tone and stability bilateral.  Hallux is medially oriented and rubbing on the second toe of the right foot causing a callus to form.    DERMATOLOGIC: skin is warm, supple, and dry.  Hyperkeratotic lesion medial right hallux is present.  Tenderness to palpation noted. Skin intact- no abrasions or open lesions noted.   Assessment     ICD-10-CM   1. Callus between toes  L84        Plan  Discussed the callus is forming due to the friction of the toes rubbing together.  At todays  visit I trimmed down the lesion and offered spacer options.  Should she need further treatment she will call.  May be candidate for surgical intervention should the callus continue to return.

## 2022-02-18 DIAGNOSIS — I10 Essential (primary) hypertension: Secondary | ICD-10-CM | POA: Diagnosis not present

## 2022-02-18 DIAGNOSIS — H9191 Unspecified hearing loss, right ear: Secondary | ICD-10-CM | POA: Diagnosis not present

## 2022-02-18 DIAGNOSIS — K2970 Gastritis, unspecified, without bleeding: Secondary | ICD-10-CM | POA: Diagnosis not present

## 2022-02-18 DIAGNOSIS — K219 Gastro-esophageal reflux disease without esophagitis: Secondary | ICD-10-CM | POA: Diagnosis not present

## 2022-02-19 DIAGNOSIS — D125 Benign neoplasm of sigmoid colon: Secondary | ICD-10-CM | POA: Diagnosis not present

## 2022-02-19 DIAGNOSIS — D124 Benign neoplasm of descending colon: Secondary | ICD-10-CM | POA: Diagnosis not present

## 2022-02-19 DIAGNOSIS — D122 Benign neoplasm of ascending colon: Secondary | ICD-10-CM | POA: Diagnosis not present

## 2022-02-19 DIAGNOSIS — K5730 Diverticulosis of large intestine without perforation or abscess without bleeding: Secondary | ICD-10-CM | POA: Diagnosis not present

## 2022-02-19 DIAGNOSIS — Z09 Encounter for follow-up examination after completed treatment for conditions other than malignant neoplasm: Secondary | ICD-10-CM | POA: Diagnosis not present

## 2022-02-19 DIAGNOSIS — Z86010 Personal history of colonic polyps: Secondary | ICD-10-CM | POA: Diagnosis not present

## 2022-02-19 DIAGNOSIS — K5520 Angiodysplasia of colon without hemorrhage: Secondary | ICD-10-CM | POA: Diagnosis not present

## 2022-02-19 DIAGNOSIS — K648 Other hemorrhoids: Secondary | ICD-10-CM | POA: Diagnosis not present

## 2022-03-08 DIAGNOSIS — Z8546 Personal history of malignant neoplasm of prostate: Secondary | ICD-10-CM | POA: Diagnosis not present

## 2022-03-15 DIAGNOSIS — Z8546 Personal history of malignant neoplasm of prostate: Secondary | ICD-10-CM | POA: Diagnosis not present

## 2022-03-15 DIAGNOSIS — N5201 Erectile dysfunction due to arterial insufficiency: Secondary | ICD-10-CM | POA: Diagnosis not present

## 2022-03-28 DIAGNOSIS — Z79899 Other long term (current) drug therapy: Secondary | ICD-10-CM | POA: Diagnosis not present

## 2022-03-28 DIAGNOSIS — R7301 Impaired fasting glucose: Secondary | ICD-10-CM | POA: Diagnosis not present

## 2022-03-28 DIAGNOSIS — E7800 Pure hypercholesterolemia, unspecified: Secondary | ICD-10-CM | POA: Diagnosis not present

## 2022-03-28 DIAGNOSIS — Z0000 Encounter for general adult medical examination without abnormal findings: Secondary | ICD-10-CM | POA: Diagnosis not present

## 2022-04-04 DIAGNOSIS — H65111 Acute and subacute allergic otitis media (mucoid) (sanguinous) (serous), right ear: Secondary | ICD-10-CM | POA: Diagnosis not present

## 2022-04-14 ENCOUNTER — Other Ambulatory Visit: Payer: Self-pay | Admitting: Cardiology

## 2022-04-14 DIAGNOSIS — E782 Mixed hyperlipidemia: Secondary | ICD-10-CM

## 2022-05-20 DIAGNOSIS — H9191 Unspecified hearing loss, right ear: Secondary | ICD-10-CM | POA: Diagnosis not present

## 2022-07-01 DIAGNOSIS — H90A12 Conductive hearing loss, unilateral, left ear with restricted hearing on the contralateral side: Secondary | ICD-10-CM | POA: Diagnosis not present

## 2022-07-23 ENCOUNTER — Other Ambulatory Visit: Payer: Self-pay | Admitting: Cardiology

## 2022-07-23 DIAGNOSIS — E782 Mixed hyperlipidemia: Secondary | ICD-10-CM

## 2022-08-23 DIAGNOSIS — R1013 Epigastric pain: Secondary | ICD-10-CM | POA: Diagnosis not present

## 2022-08-23 DIAGNOSIS — R0789 Other chest pain: Secondary | ICD-10-CM | POA: Diagnosis not present

## 2022-09-18 ENCOUNTER — Other Ambulatory Visit: Payer: Self-pay | Admitting: Family Medicine

## 2022-09-18 DIAGNOSIS — R066 Hiccough: Secondary | ICD-10-CM

## 2022-09-18 DIAGNOSIS — K2270 Barrett's esophagus without dysplasia: Secondary | ICD-10-CM

## 2022-10-17 ENCOUNTER — Ambulatory Visit
Admission: RE | Admit: 2022-10-17 | Discharge: 2022-10-17 | Disposition: A | Discharge status: Home / Self Care | Payer: No Typology Code available for payment source | Source: Ambulatory Visit | Attending: Family Medicine | Admitting: Family Medicine

## 2022-10-17 DIAGNOSIS — R1013 Epigastric pain: Secondary | ICD-10-CM | POA: Diagnosis not present

## 2022-10-17 DIAGNOSIS — K2270 Barrett's esophagus without dysplasia: Secondary | ICD-10-CM

## 2022-10-17 DIAGNOSIS — K5711 Diverticulosis of small intestine without perforation or abscess with bleeding: Secondary | ICD-10-CM | POA: Diagnosis not present

## 2022-10-17 DIAGNOSIS — I700 Atherosclerosis of aorta: Secondary | ICD-10-CM | POA: Diagnosis not present

## 2022-10-17 DIAGNOSIS — K769 Liver disease, unspecified: Secondary | ICD-10-CM | POA: Diagnosis not present

## 2022-10-17 DIAGNOSIS — R066 Hiccough: Secondary | ICD-10-CM

## 2022-10-17 MED ORDER — IOPAMIDOL (ISOVUE-300) INJECTION 61%
100.0000 mL | Freq: Once | INTRAVENOUS | Status: AC | PRN
  Administered 2022-10-17: 100 mL via INTRAVENOUS

## 2022-10-19 ENCOUNTER — Other Ambulatory Visit: Payer: Self-pay | Admitting: Cardiology

## 2022-10-19 DIAGNOSIS — E782 Mixed hyperlipidemia: Secondary | ICD-10-CM

## 2022-10-21 ENCOUNTER — Other Ambulatory Visit: Payer: Self-pay | Admitting: Cardiology

## 2022-10-21 DIAGNOSIS — E782 Mixed hyperlipidemia: Secondary | ICD-10-CM

## 2022-11-04 DIAGNOSIS — H900 Conductive hearing loss, bilateral: Secondary | ICD-10-CM | POA: Diagnosis not present

## 2022-11-04 DIAGNOSIS — H65493 Other chronic nonsuppurative otitis media, bilateral: Secondary | ICD-10-CM | POA: Diagnosis not present

## 2022-11-04 DIAGNOSIS — H6993 Unspecified Eustachian tube disorder, bilateral: Secondary | ICD-10-CM | POA: Diagnosis not present

## 2022-11-22 DIAGNOSIS — H6993 Unspecified Eustachian tube disorder, bilateral: Secondary | ICD-10-CM | POA: Diagnosis not present

## 2022-11-22 DIAGNOSIS — H65493 Other chronic nonsuppurative otitis media, bilateral: Secondary | ICD-10-CM | POA: Diagnosis not present

## 2022-11-22 DIAGNOSIS — H900 Conductive hearing loss, bilateral: Secondary | ICD-10-CM | POA: Diagnosis not present

## 2022-11-25 IMAGING — CT CT CARDIAC CORONARY ARTERY CALCIUM SCORE
3 series · 12 of 20 positions shown, 14 images · non-contrast
Comparison: None.

CLINICAL DATA: 59-year-old African American male with family
history of heart disease.

EXAM:
CT CARDIAC CORONARY ARTERY CALCIUM SCORE
TECHNIQUE: Non-contrast imaging through the heart was performed using
prospective ECG gating. Image post processing was performed on an
independent workstation, allowing for quantitative analysis of the
heart and coronary arteries. Note that this exam targets the heart
and the chest was not imaged in its entirety.

[Series 2: calcium scoring 2.00 qr36 bestdiast 69% hrt calciu · axial · 0.31mm/px · z∈[+1680,+1708]mm · 2 of 70 slices shown]
[im 14/70  vessel]
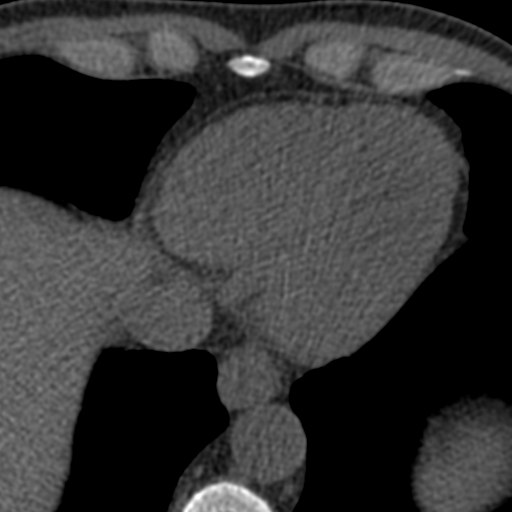
[im 28/70  vessel]
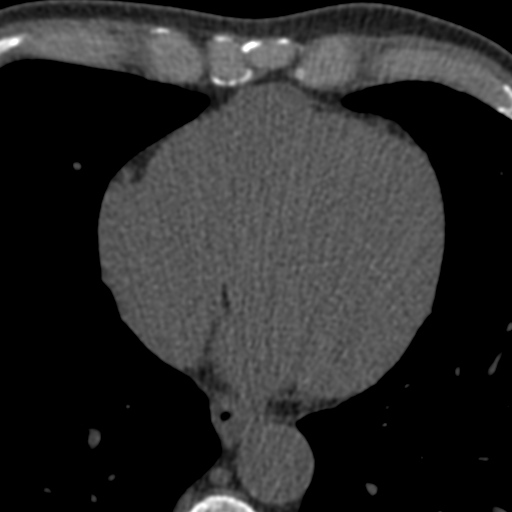

[Series 3: calcium scoring 2.00 br40 bestdiast 69% axial · axial · 0.54mm/px · z∈[+1676,+1768]mm · 5 of 70 slices shown, 7 images]
[im 12/70  vessel]
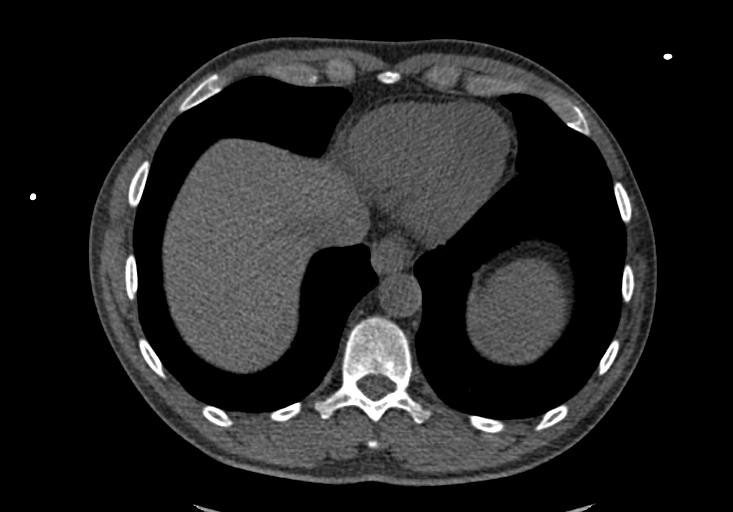
[im 12/70  lung]
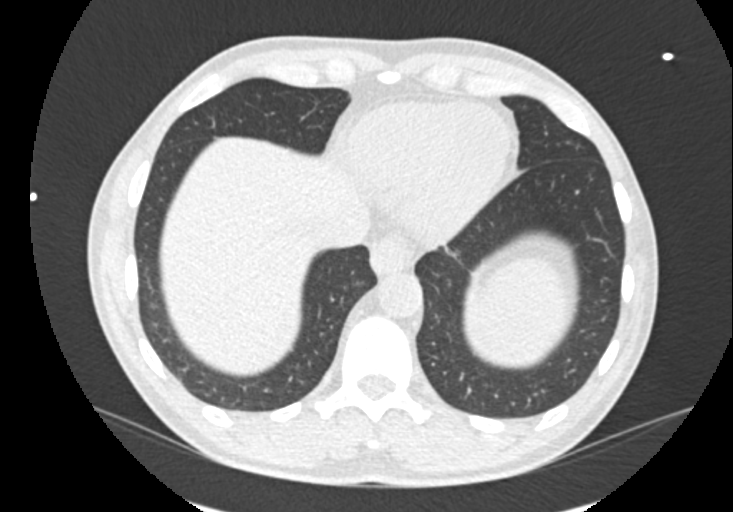
[im 24/70  vessel]
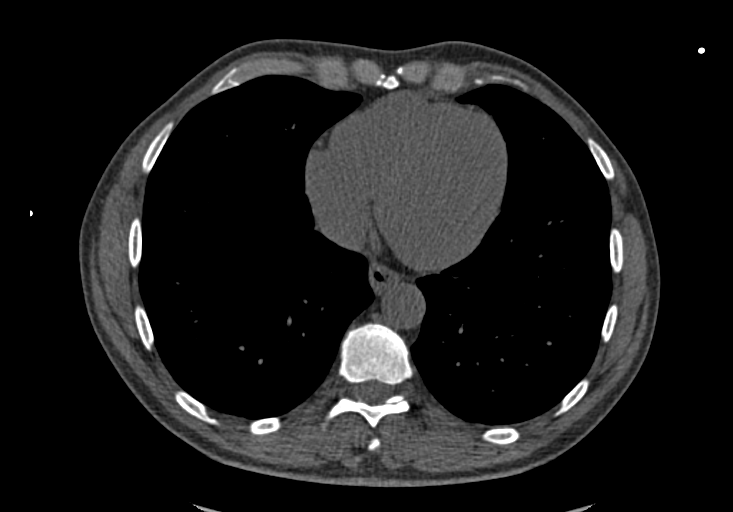
[im 35/70  vessel]
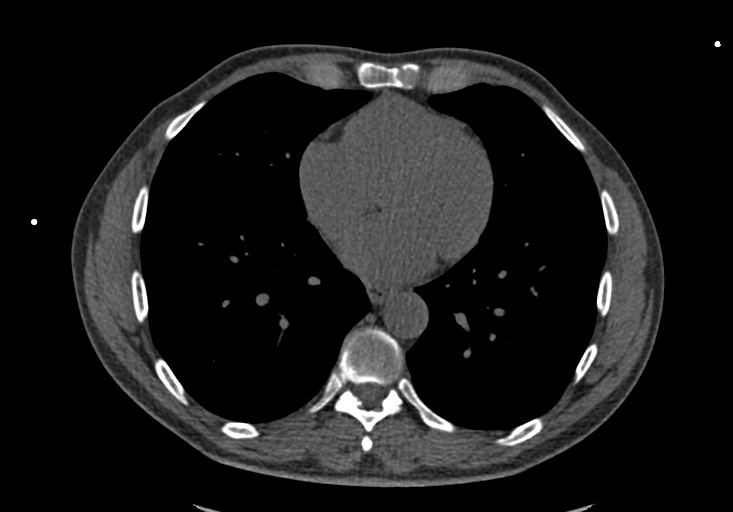
[im 47/70  vessel]
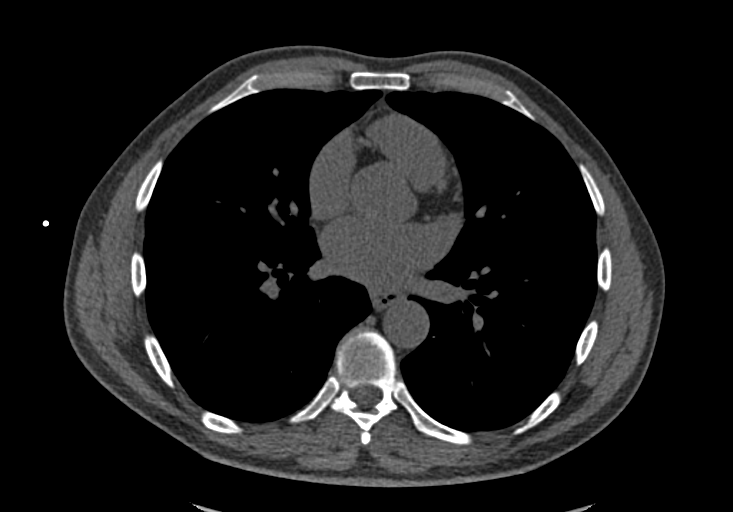
[im 58/70  vessel]
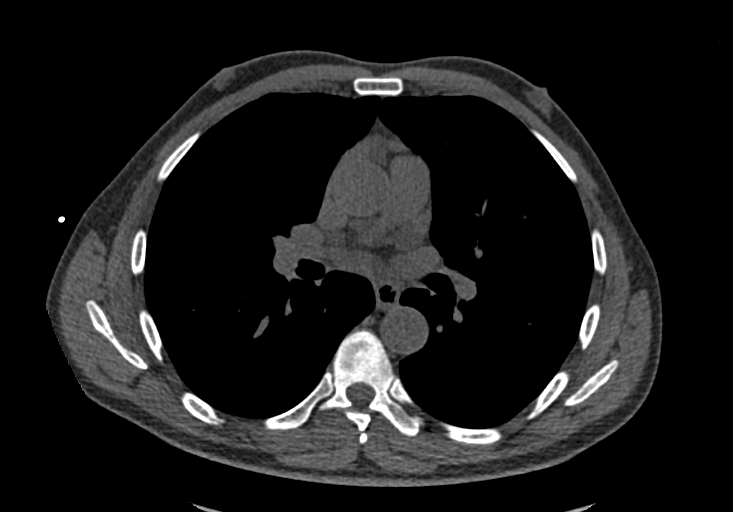
[im 58/70  lung]
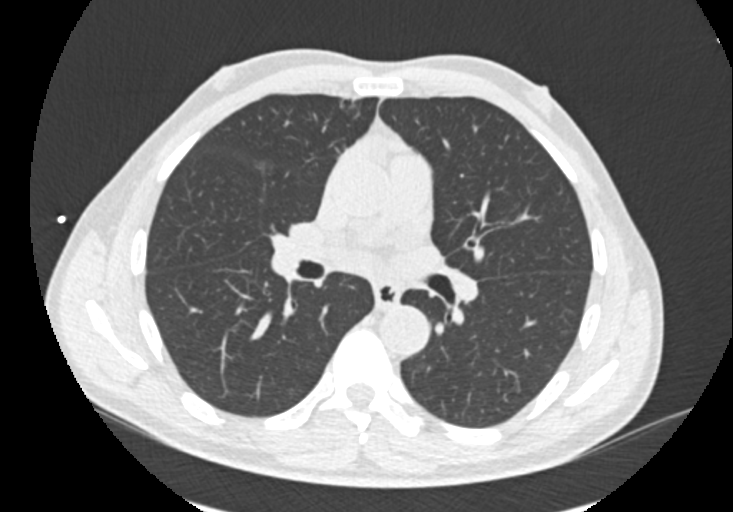

[Series 9: calcium scoring 2.00 br60 bestdiast 69% lungs · axial · 0.54mm/px · z∈[+1676,+1768]mm · 5 of 70 slices shown]
[im 12/70  vessel]
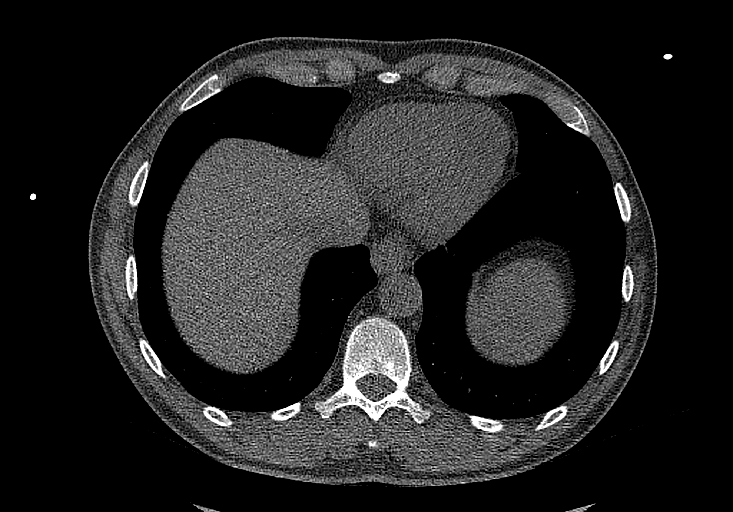
[im 24/70  vessel]
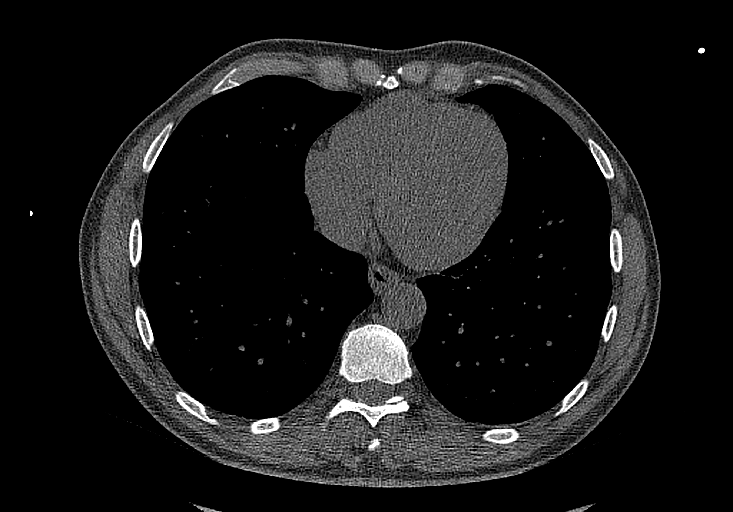
[im 35/70  vessel]
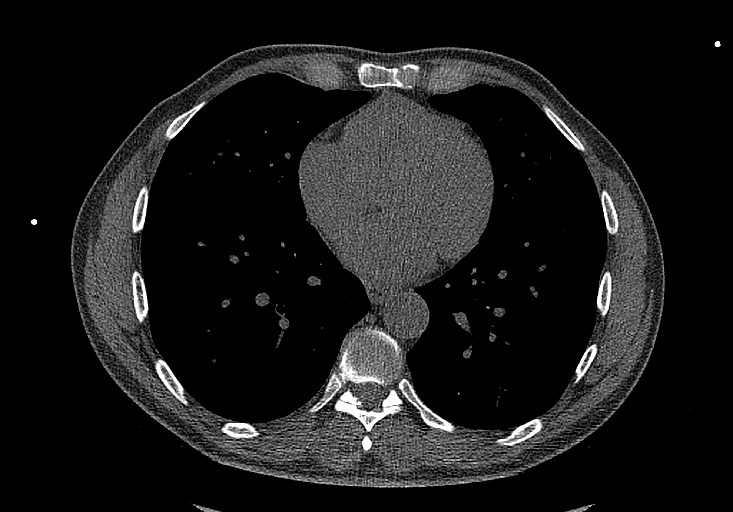
[im 47/70  vessel]
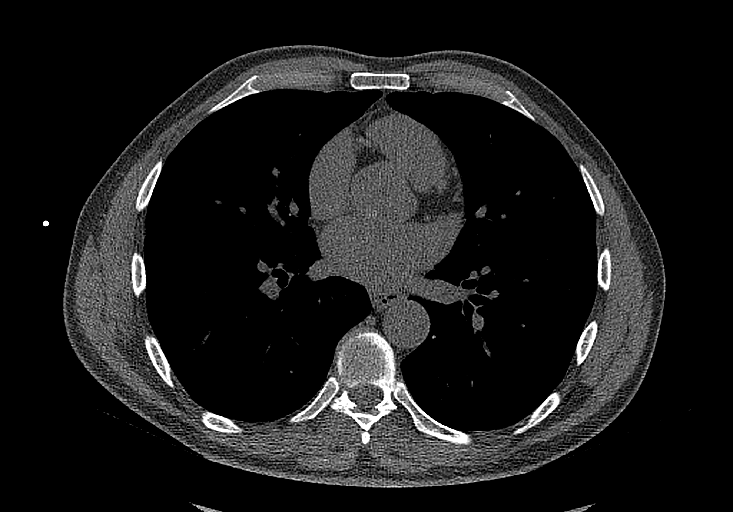
[im 58/70  vessel]
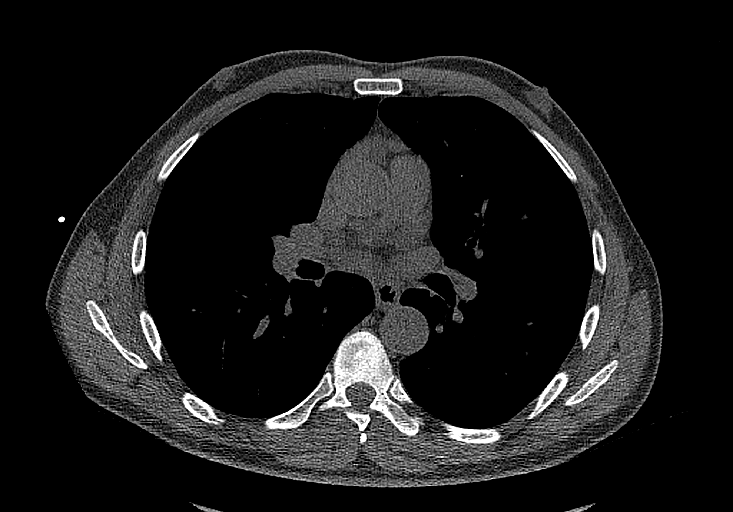

[12 of 20 positions shown; findings below may reference images not displayed]

FINDINGS: CORONARY CALCIUM SCORES:

Left Main: 0

LAD: 0

LCx: 0

RCA: 0

Total Agatston Score: 0

[HOSPITAL] percentile: 0

AORTA MEASUREMENTS:

Ascending Aorta: 29 mm

Descending Aorta: 24 mm

OTHER FINDINGS:

The heart size is within normal limits. No pericardial fluid
identified. There is focal calcification associated with the aortic
valve. Visualized segments of the thoracic aorta and central
pulmonary arteries are normal in caliber. Visualized mediastinum and
hilar regions demonstrate no lymphadenopathy or masses. Visualized
lungs show no evidence of pulmonary edema, consolidation,
pneumothorax, nodule or pleural fluid. Visualized upper abdomen and
bony structures are unremarkable.
IMPRESSION: 1. Coronary calcium score of 0.
2. Focal calcification associated with the aortic valve. Correlation
with echocardiography may be helpful to evaluate aortic valve
morphology and function.

## 2022-12-24 DIAGNOSIS — Z9622 Myringotomy tube(s) status: Secondary | ICD-10-CM | POA: Diagnosis not present

## 2022-12-24 DIAGNOSIS — H93293 Other abnormal auditory perceptions, bilateral: Secondary | ICD-10-CM | POA: Diagnosis not present

## 2022-12-24 DIAGNOSIS — H6993 Unspecified Eustachian tube disorder, bilateral: Secondary | ICD-10-CM | POA: Diagnosis not present

## 2023-01-10 DIAGNOSIS — R935 Abnormal findings on diagnostic imaging of other abdominal regions, including retroperitoneum: Secondary | ICD-10-CM | POA: Diagnosis not present

## 2023-02-24 DIAGNOSIS — H6993 Unspecified Eustachian tube disorder, bilateral: Secondary | ICD-10-CM | POA: Diagnosis not present

## 2023-02-24 DIAGNOSIS — H9212 Otorrhea, left ear: Secondary | ICD-10-CM | POA: Diagnosis not present

## 2023-02-24 DIAGNOSIS — H903 Sensorineural hearing loss, bilateral: Secondary | ICD-10-CM | POA: Diagnosis not present

## 2023-02-24 DIAGNOSIS — Z9622 Myringotomy tube(s) status: Secondary | ICD-10-CM | POA: Diagnosis not present

## 2023-03-13 DIAGNOSIS — Z8546 Personal history of malignant neoplasm of prostate: Secondary | ICD-10-CM | POA: Diagnosis not present

## 2023-03-20 DIAGNOSIS — N5201 Erectile dysfunction due to arterial insufficiency: Secondary | ICD-10-CM | POA: Diagnosis not present

## 2023-03-20 DIAGNOSIS — Z8546 Personal history of malignant neoplasm of prostate: Secondary | ICD-10-CM | POA: Diagnosis not present

## 2023-03-21 ENCOUNTER — Ambulatory Visit (INDEPENDENT_AMBULATORY_CARE_PROVIDER_SITE_OTHER): Payer: BC Managed Care – PPO | Admitting: Podiatry

## 2023-03-21 ENCOUNTER — Encounter: Payer: Self-pay | Admitting: Podiatry

## 2023-03-21 ENCOUNTER — Ambulatory Visit (INDEPENDENT_AMBULATORY_CARE_PROVIDER_SITE_OTHER): Payer: BC Managed Care – PPO

## 2023-03-21 DIAGNOSIS — M778 Other enthesopathies, not elsewhere classified: Secondary | ICD-10-CM

## 2023-03-21 DIAGNOSIS — M7752 Other enthesopathy of left foot: Secondary | ICD-10-CM

## 2023-03-21 MED ORDER — TRIAMCINOLONE ACETONIDE 10 MG/ML IJ SUSP
10.0000 mg | Freq: Once | INTRAMUSCULAR | Status: AC
Start: 2023-03-21 — End: 2023-03-21
  Administered 2023-03-21: 10 mg via INTRA_ARTICULAR

## 2023-03-21 MED ORDER — DICLOFENAC SODIUM 75 MG PO TBEC: 75.0000 mg | DELAYED_RELEASE_TABLET | Freq: Two times a day (BID) | ORAL | 2 refills | Status: AC

## 2023-03-24 NOTE — Progress Notes (Signed)
Subjective:   Patient ID: Justin Macias, male   DOB: 62 y.o.   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

## 2023-03-31 DIAGNOSIS — E7800 Pure hypercholesterolemia, unspecified: Secondary | ICD-10-CM | POA: Diagnosis not present

## 2023-03-31 DIAGNOSIS — Z79899 Other long term (current) drug therapy: Secondary | ICD-10-CM | POA: Diagnosis not present

## 2023-03-31 DIAGNOSIS — R7303 Prediabetes: Secondary | ICD-10-CM | POA: Diagnosis not present

## 2023-04-21 ENCOUNTER — Ambulatory Visit: Payer: BC Managed Care – PPO | Admitting: Podiatry

## 2023-04-28 ENCOUNTER — Encounter: Payer: Self-pay | Admitting: Podiatry

## 2023-04-28 ENCOUNTER — Ambulatory Visit (INDEPENDENT_AMBULATORY_CARE_PROVIDER_SITE_OTHER): Payer: BC Managed Care – PPO | Admitting: Podiatry

## 2023-04-28 VITALS — Ht 67.0 in | Wt 156.0 lb

## 2023-04-28 DIAGNOSIS — L84 Corns and callosities: Secondary | ICD-10-CM

## 2023-04-28 DIAGNOSIS — M7752 Other enthesopathy of left foot: Secondary | ICD-10-CM

## 2023-04-29 NOTE — Progress Notes (Signed)
Subjective:   Patient ID: Justin Macias, male   DOB: 63 y.o.   MRN: [id number]   HPI Patient states doing much better with foot after treatment around a month ago   ROS      Objective:  Physical Exam  Neurovascular status intact with the patient's left foot feeling much better and upon palpation significant reduction of pain currently with what appears to be good shoe gear choices that he is making     Assessment:  Seems to be doing well after treatment for forefoot capsulitis left     Plan:  H&P reviewed recommended shoe gear modifications continuing to take oral anti-inflammatories but gradual reduction and may require orthotics or other treatment depending on how he does

## 2023-07-30 IMAGING — CR DG CHEST 2V
2 series · 2 of 2 positions shown · non-contrast
Comparison: Chest x-ray dated August 22, 2020

CLINICAL DATA: Chest pain

EXAM:
CHEST - 2 VIEW

[chest pa]
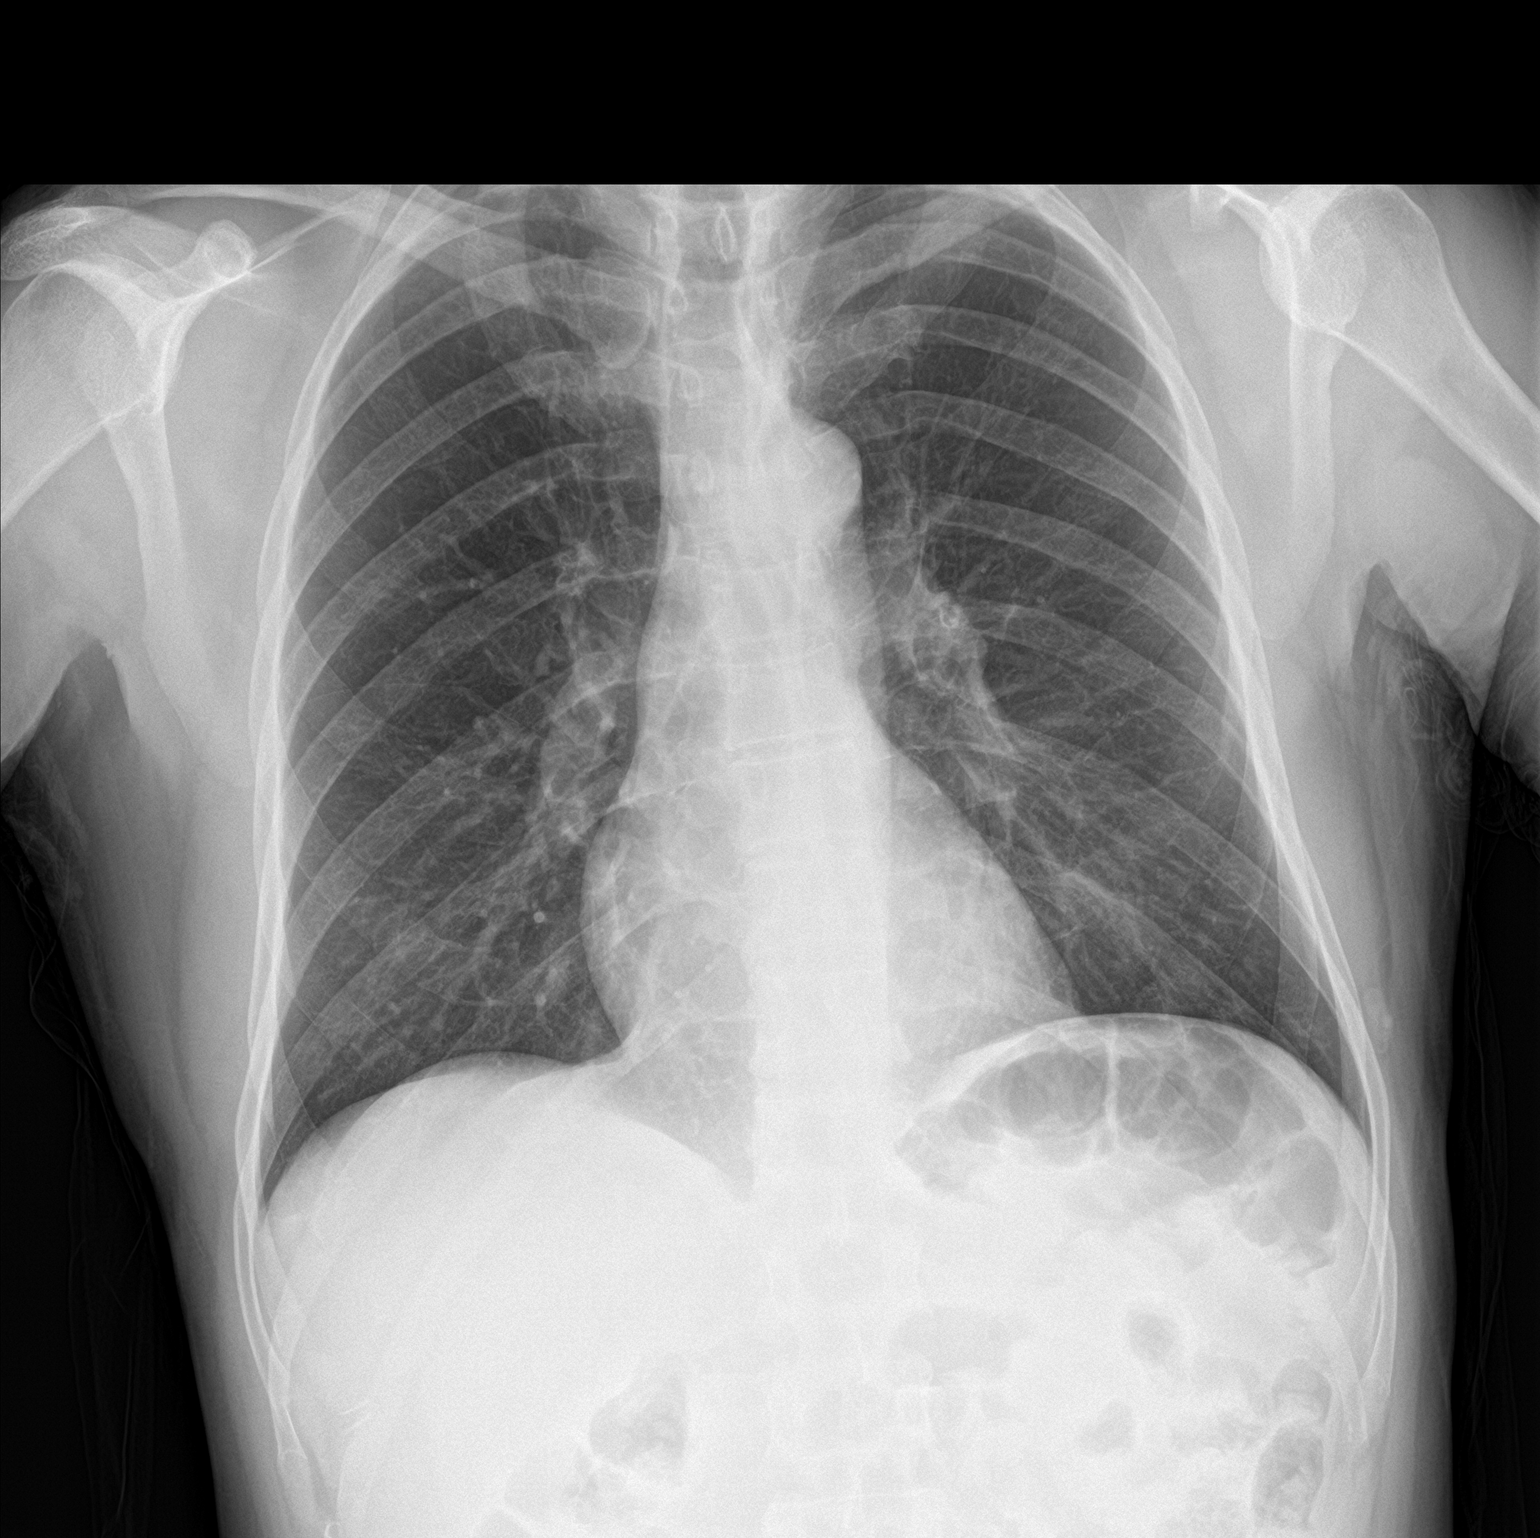

[chest lat]
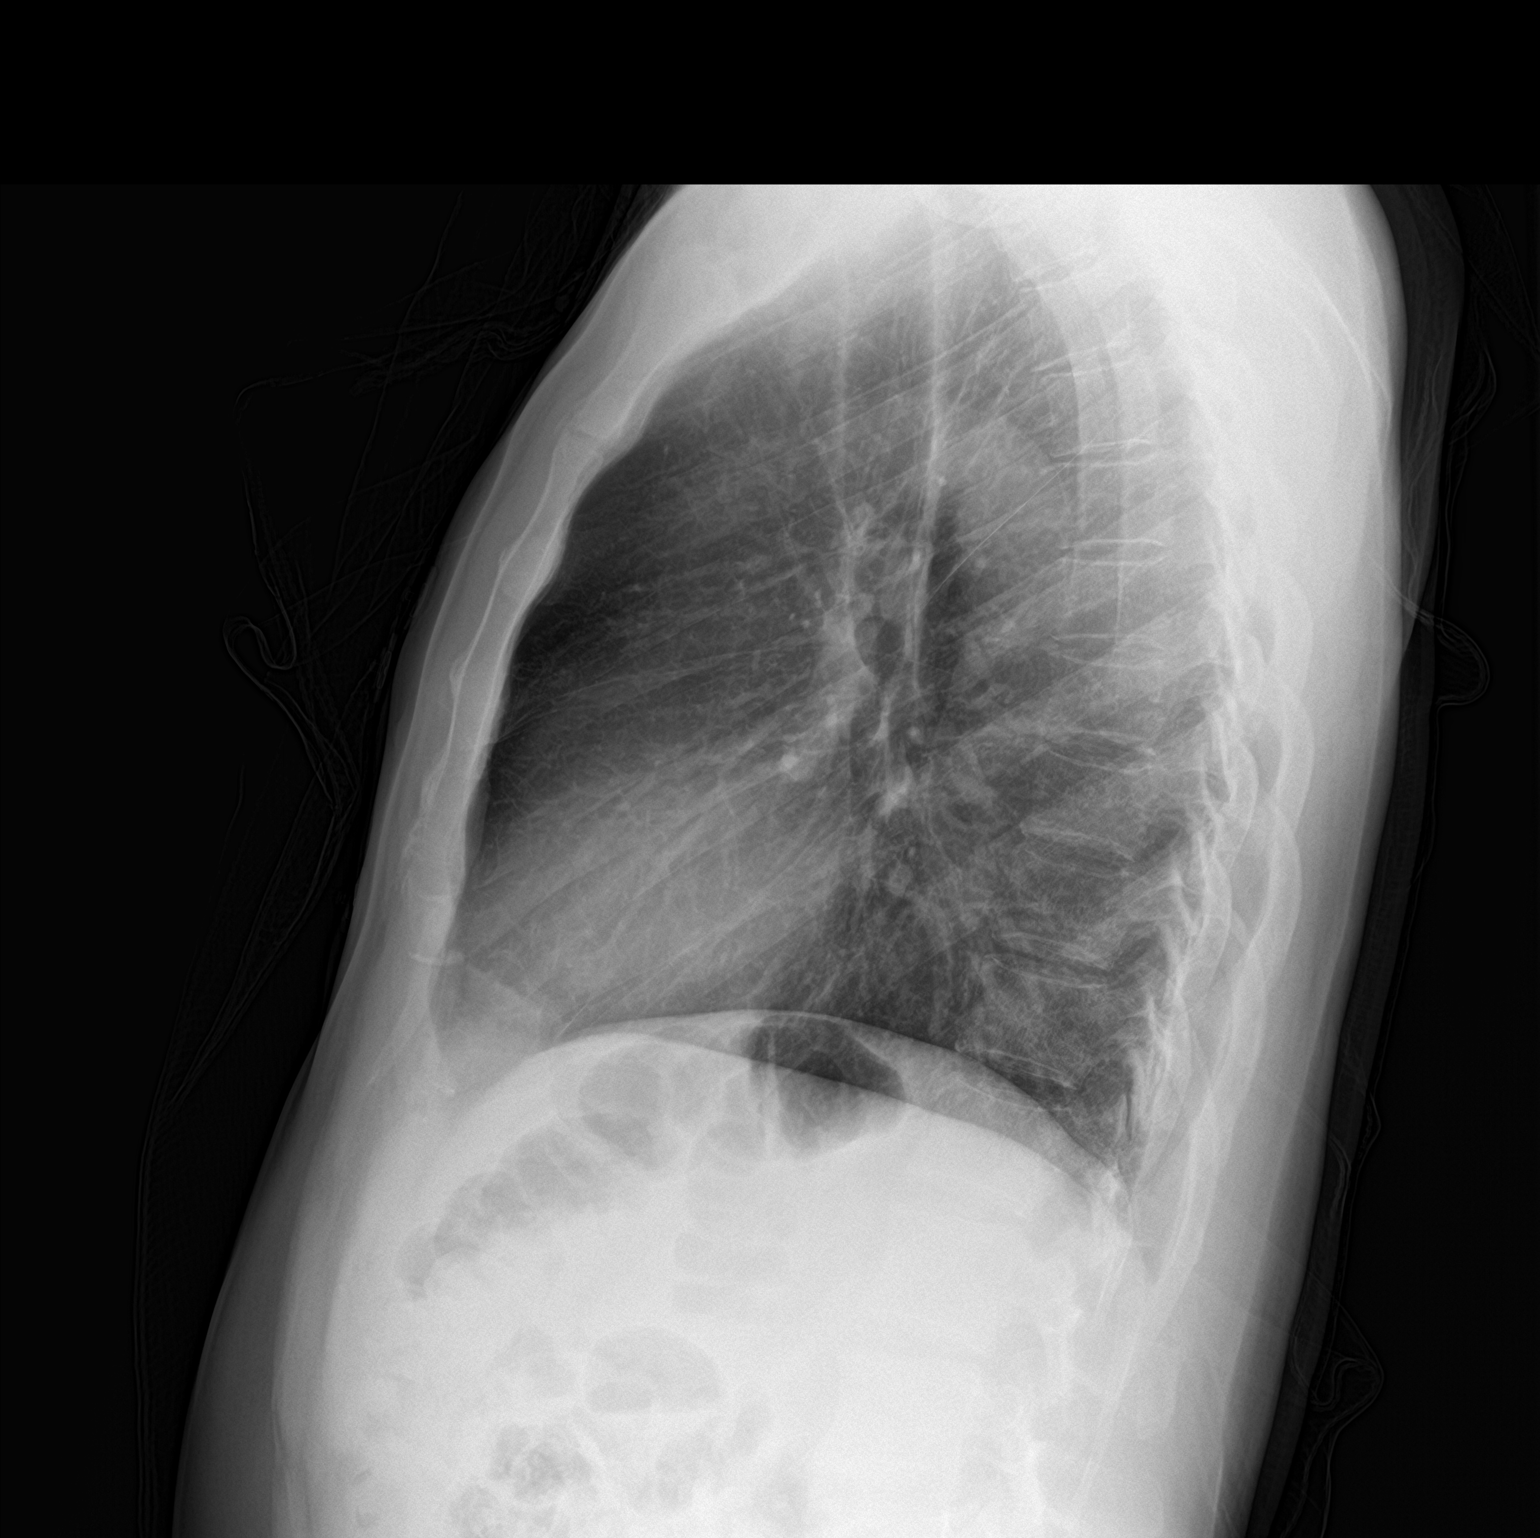

[2 of 2 positions shown; findings below may reference images not displayed]

FINDINGS: The heart size and mediastinal contours are within normal limits.
Both lungs are clear. The visualized skeletal structures are
unremarkable.
IMPRESSION: No active cardiopulmonary disease.

## 2024-01-20 ENCOUNTER — Encounter: Payer: Self-pay | Admitting: Student

## 2024-01-20 ENCOUNTER — Other Ambulatory Visit: Payer: Self-pay | Admitting: Student

## 2024-01-20 DIAGNOSIS — R1901 Right upper quadrant abdominal swelling, mass and lump: Secondary | ICD-10-CM
